# Patient Record
Sex: Female | Born: 1970 | Race: White | Hispanic: No | Marital: Married | Smoking: Current every day smoker
Health system: Southern US, Community
[De-identification: ages and names within clinical notes are randomized; demographics above are authoritative.]

## PROBLEM LIST (undated history)

## (undated) DIAGNOSIS — J45909 Unspecified asthma, uncomplicated: Secondary | ICD-10-CM

## (undated) DIAGNOSIS — D219 Benign neoplasm of connective and other soft tissue, unspecified: Secondary | ICD-10-CM

## (undated) DIAGNOSIS — M549 Dorsalgia, unspecified: Secondary | ICD-10-CM

## (undated) DIAGNOSIS — K219 Gastro-esophageal reflux disease without esophagitis: Secondary | ICD-10-CM

## (undated) DIAGNOSIS — D649 Anemia, unspecified: Secondary | ICD-10-CM

## (undated) DIAGNOSIS — Z9189 Other specified personal risk factors, not elsewhere classified: Secondary | ICD-10-CM

## (undated) DIAGNOSIS — F419 Anxiety disorder, unspecified: Secondary | ICD-10-CM

## (undated) DIAGNOSIS — G8929 Other chronic pain: Secondary | ICD-10-CM

## (undated) DIAGNOSIS — R87629 Unspecified abnormal cytological findings in specimens from vagina: Secondary | ICD-10-CM

## (undated) DIAGNOSIS — G4730 Sleep apnea, unspecified: Secondary | ICD-10-CM

## (undated) HISTORY — PX: MULTIPLE TOOTH EXTRACTIONS: SHX2053

## (undated) HISTORY — PX: TUBAL LIGATION: SHX77

## (undated) HISTORY — PX: OTHER SURGICAL HISTORY: SHX169

## (undated) HISTORY — PX: WISDOM TOOTH EXTRACTION: SHX21

---
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
## 1992-07-13 HISTORY — PX: RHINOPLASTY: SUR1284

## 1997-12-23 ENCOUNTER — Emergency Department (HOSPITAL_COMMUNITY): Admission: EM | Admit: 1997-12-23 | Discharge: 1997-12-23 | Payer: Self-pay | Admitting: Emergency Medicine

## 1998-01-15 ENCOUNTER — Emergency Department (HOSPITAL_COMMUNITY): Admission: EM | Admit: 1998-01-15 | Discharge: 1998-01-15 | Payer: Self-pay | Admitting: Emergency Medicine

## 1998-06-04 ENCOUNTER — Emergency Department (HOSPITAL_COMMUNITY): Admission: EM | Admit: 1998-06-04 | Discharge: 1998-06-04 | Payer: Self-pay | Admitting: Emergency Medicine

## 1998-06-04 ENCOUNTER — Encounter: Payer: Self-pay | Admitting: Emergency Medicine

## 1999-03-17 ENCOUNTER — Emergency Department (HOSPITAL_COMMUNITY): Admission: EM | Admit: 1999-03-17 | Discharge: 1999-03-18 | Payer: Self-pay | Admitting: Emergency Medicine

## 1999-04-21 ENCOUNTER — Encounter: Payer: Self-pay | Admitting: Emergency Medicine

## 1999-04-21 ENCOUNTER — Emergency Department (HOSPITAL_COMMUNITY): Admission: EM | Admit: 1999-04-21 | Discharge: 1999-04-21 | Payer: Self-pay | Admitting: Emergency Medicine

## 1999-08-09 ENCOUNTER — Emergency Department (HOSPITAL_COMMUNITY): Admission: EM | Admit: 1999-08-09 | Discharge: 1999-08-09 | Payer: Self-pay | Admitting: Internal Medicine

## 1999-08-13 ENCOUNTER — Encounter: Payer: Self-pay | Admitting: Emergency Medicine

## 1999-08-13 ENCOUNTER — Emergency Department (HOSPITAL_COMMUNITY): Admission: EM | Admit: 1999-08-13 | Discharge: 1999-08-13 | Payer: Self-pay | Admitting: Emergency Medicine

## 2000-01-31 ENCOUNTER — Emergency Department (HOSPITAL_COMMUNITY): Admission: EM | Admit: 2000-01-31 | Discharge: 2000-02-01 | Payer: Self-pay | Admitting: Emergency Medicine

## 2000-04-16 ENCOUNTER — Encounter: Admission: RE | Admit: 2000-04-16 | Discharge: 2000-04-16 | Payer: Self-pay | Admitting: Obstetrics & Gynecology

## 2000-05-07 ENCOUNTER — Encounter: Admission: RE | Admit: 2000-05-07 | Discharge: 2000-05-07 | Payer: Self-pay | Admitting: Obstetrics & Gynecology

## 2000-05-28 ENCOUNTER — Encounter: Admission: RE | Admit: 2000-05-28 | Discharge: 2000-05-28 | Payer: Self-pay | Admitting: Obstetrics & Gynecology

## 2000-06-24 ENCOUNTER — Ambulatory Visit (HOSPITAL_COMMUNITY): Admission: RE | Admit: 2000-06-24 | Discharge: 2000-06-24 | Payer: Self-pay

## 2000-08-24 ENCOUNTER — Encounter: Admission: RE | Admit: 2000-08-24 | Discharge: 2000-08-24 | Payer: Self-pay | Admitting: Obstetrics & Gynecology

## 2000-10-26 ENCOUNTER — Inpatient Hospital Stay (HOSPITAL_COMMUNITY): Admission: AD | Admit: 2000-10-26 | Discharge: 2000-10-26 | Payer: Self-pay | Admitting: *Deleted

## 2000-11-30 ENCOUNTER — Encounter: Admission: RE | Admit: 2000-11-30 | Discharge: 2000-11-30 | Payer: Self-pay | Admitting: Obstetrics & Gynecology

## 2001-04-05 ENCOUNTER — Emergency Department (HOSPITAL_COMMUNITY): Admission: EM | Admit: 2001-04-05 | Discharge: 2001-04-05 | Payer: Self-pay | Admitting: Emergency Medicine

## 2001-04-26 ENCOUNTER — Emergency Department (HOSPITAL_COMMUNITY): Admission: EM | Admit: 2001-04-26 | Discharge: 2001-04-26 | Payer: Self-pay | Admitting: Emergency Medicine

## 2001-06-07 ENCOUNTER — Encounter: Admission: RE | Admit: 2001-06-07 | Discharge: 2001-06-07 | Payer: Self-pay | Admitting: Obstetrics & Gynecology

## 2001-06-23 ENCOUNTER — Emergency Department (HOSPITAL_COMMUNITY): Admission: EM | Admit: 2001-06-23 | Discharge: 2001-06-23 | Payer: Self-pay | Admitting: Emergency Medicine

## 2001-07-12 ENCOUNTER — Inpatient Hospital Stay (HOSPITAL_COMMUNITY): Admission: AD | Admit: 2001-07-12 | Discharge: 2001-07-12 | Payer: Self-pay | Admitting: *Deleted

## 2001-07-12 ENCOUNTER — Encounter: Payer: Self-pay | Admitting: *Deleted

## 2001-09-27 ENCOUNTER — Encounter: Payer: Self-pay | Admitting: *Deleted

## 2001-09-27 ENCOUNTER — Emergency Department (HOSPITAL_COMMUNITY): Admission: EM | Admit: 2001-09-27 | Discharge: 2001-09-27 | Payer: Self-pay | Admitting: *Deleted

## 2001-10-13 ENCOUNTER — Encounter: Payer: Self-pay | Admitting: *Deleted

## 2001-10-13 ENCOUNTER — Emergency Department (HOSPITAL_COMMUNITY): Admission: EM | Admit: 2001-10-13 | Discharge: 2001-10-13 | Payer: Self-pay | Admitting: *Deleted

## 2001-11-27 ENCOUNTER — Emergency Department (HOSPITAL_COMMUNITY): Admission: EM | Admit: 2001-11-27 | Discharge: 2001-11-27 | Payer: Self-pay | Admitting: *Deleted

## 2001-11-27 ENCOUNTER — Encounter: Payer: Self-pay | Admitting: *Deleted

## 2001-12-19 ENCOUNTER — Encounter: Payer: Self-pay | Admitting: Emergency Medicine

## 2001-12-19 ENCOUNTER — Emergency Department (HOSPITAL_COMMUNITY): Admission: EM | Admit: 2001-12-19 | Discharge: 2001-12-19 | Payer: Self-pay | Admitting: Emergency Medicine

## 2002-02-07 ENCOUNTER — Encounter: Payer: Self-pay | Admitting: Emergency Medicine

## 2002-02-07 ENCOUNTER — Emergency Department (HOSPITAL_COMMUNITY): Admission: EM | Admit: 2002-02-07 | Discharge: 2002-02-07 | Payer: Self-pay | Admitting: Emergency Medicine

## 2002-12-26 ENCOUNTER — Encounter: Payer: Self-pay | Admitting: *Deleted

## 2002-12-26 ENCOUNTER — Inpatient Hospital Stay (HOSPITAL_COMMUNITY): Admission: AD | Admit: 2002-12-26 | Discharge: 2002-12-26 | Payer: Self-pay | Admitting: *Deleted

## 2003-03-13 ENCOUNTER — Encounter: Payer: Self-pay | Admitting: Emergency Medicine

## 2003-03-13 ENCOUNTER — Emergency Department (HOSPITAL_COMMUNITY): Admission: EM | Admit: 2003-03-13 | Discharge: 2003-03-13 | Payer: Self-pay | Admitting: Emergency Medicine

## 2003-03-21 ENCOUNTER — Encounter: Payer: Self-pay | Admitting: *Deleted

## 2003-03-21 ENCOUNTER — Emergency Department (HOSPITAL_COMMUNITY): Admission: EM | Admit: 2003-03-21 | Discharge: 2003-03-21 | Payer: Self-pay | Admitting: *Deleted

## 2003-04-27 ENCOUNTER — Emergency Department (HOSPITAL_COMMUNITY): Admission: EM | Admit: 2003-04-27 | Discharge: 2003-04-27 | Payer: Self-pay | Admitting: Emergency Medicine

## 2003-05-13 ENCOUNTER — Emergency Department (HOSPITAL_COMMUNITY): Admission: EM | Admit: 2003-05-13 | Discharge: 2003-05-14 | Payer: Self-pay | Admitting: Emergency Medicine

## 2003-06-12 ENCOUNTER — Emergency Department (HOSPITAL_COMMUNITY): Admission: EM | Admit: 2003-06-12 | Discharge: 2003-06-12 | Payer: Self-pay | Admitting: *Deleted

## 2003-07-15 ENCOUNTER — Emergency Department (HOSPITAL_COMMUNITY): Admission: EM | Admit: 2003-07-15 | Discharge: 2003-07-15 | Payer: Self-pay | Admitting: Emergency Medicine

## 2003-07-16 ENCOUNTER — Emergency Department (HOSPITAL_COMMUNITY): Admission: EM | Admit: 2003-07-16 | Discharge: 2003-07-16 | Payer: Self-pay | Admitting: Emergency Medicine

## 2003-08-31 ENCOUNTER — Emergency Department (HOSPITAL_COMMUNITY): Admission: EM | Admit: 2003-08-31 | Discharge: 2003-08-31 | Payer: Self-pay | Admitting: Emergency Medicine

## 2003-10-06 ENCOUNTER — Emergency Department (HOSPITAL_COMMUNITY): Admission: EM | Admit: 2003-10-06 | Discharge: 2003-10-07 | Payer: Self-pay | Admitting: Emergency Medicine

## 2003-10-08 ENCOUNTER — Emergency Department (HOSPITAL_COMMUNITY): Admission: EM | Admit: 2003-10-08 | Discharge: 2003-10-09 | Payer: Self-pay | Admitting: Emergency Medicine

## 2003-10-11 ENCOUNTER — Emergency Department (HOSPITAL_COMMUNITY): Admission: EM | Admit: 2003-10-11 | Discharge: 2003-10-11 | Payer: Self-pay | Admitting: *Deleted

## 2003-11-06 ENCOUNTER — Emergency Department (HOSPITAL_COMMUNITY): Admission: EM | Admit: 2003-11-06 | Discharge: 2003-11-06 | Payer: Self-pay | Admitting: Emergency Medicine

## 2003-12-09 ENCOUNTER — Emergency Department (HOSPITAL_COMMUNITY): Admission: EM | Admit: 2003-12-09 | Discharge: 2003-12-09 | Payer: Self-pay | Admitting: Emergency Medicine

## 2003-12-13 ENCOUNTER — Emergency Department (HOSPITAL_COMMUNITY): Admission: EM | Admit: 2003-12-13 | Discharge: 2003-12-13 | Payer: Self-pay | Admitting: Emergency Medicine

## 2003-12-17 ENCOUNTER — Emergency Department (HOSPITAL_COMMUNITY): Admission: EM | Admit: 2003-12-17 | Discharge: 2003-12-17 | Payer: Self-pay | Admitting: Emergency Medicine

## 2004-02-16 ENCOUNTER — Emergency Department (HOSPITAL_COMMUNITY): Admission: EM | Admit: 2004-02-16 | Discharge: 2004-02-16 | Payer: Self-pay | Admitting: Emergency Medicine

## 2004-05-20 ENCOUNTER — Emergency Department (HOSPITAL_COMMUNITY): Admission: EM | Admit: 2004-05-20 | Discharge: 2004-05-20 | Payer: Self-pay | Admitting: Emergency Medicine

## 2004-07-10 ENCOUNTER — Emergency Department (HOSPITAL_COMMUNITY): Admission: EM | Admit: 2004-07-10 | Discharge: 2004-07-10 | Payer: Self-pay | Admitting: Emergency Medicine

## 2004-07-13 HISTORY — PX: EXPLORATORY LAPAROTOMY: SUR591

## 2004-07-24 ENCOUNTER — Encounter (INDEPENDENT_AMBULATORY_CARE_PROVIDER_SITE_OTHER): Payer: Self-pay | Admitting: Specialist

## 2004-07-24 ENCOUNTER — Ambulatory Visit: Payer: Self-pay | Admitting: Family Medicine

## 2004-08-02 ENCOUNTER — Emergency Department (HOSPITAL_COMMUNITY): Admission: EM | Admit: 2004-08-02 | Discharge: 2004-08-02 | Payer: Self-pay | Admitting: Family Medicine

## 2004-08-27 ENCOUNTER — Emergency Department (HOSPITAL_COMMUNITY): Admission: EM | Admit: 2004-08-27 | Discharge: 2004-08-27 | Payer: Self-pay | Admitting: Family Medicine

## 2004-09-03 ENCOUNTER — Inpatient Hospital Stay (HOSPITAL_COMMUNITY): Admission: AD | Admit: 2004-09-03 | Discharge: 2004-09-03 | Payer: Self-pay | Admitting: Obstetrics and Gynecology

## 2004-09-05 ENCOUNTER — Encounter (INDEPENDENT_AMBULATORY_CARE_PROVIDER_SITE_OTHER): Payer: Self-pay | Admitting: Specialist

## 2004-09-05 ENCOUNTER — Other Ambulatory Visit: Admission: RE | Admit: 2004-09-05 | Discharge: 2004-09-05 | Payer: Self-pay | Admitting: Family Medicine

## 2004-09-05 ENCOUNTER — Ambulatory Visit: Payer: Self-pay | Admitting: Family Medicine

## 2004-09-16 ENCOUNTER — Ambulatory Visit: Payer: Self-pay | Admitting: Obstetrics & Gynecology

## 2004-10-10 ENCOUNTER — Emergency Department (HOSPITAL_COMMUNITY): Admission: EM | Admit: 2004-10-10 | Discharge: 2004-10-10 | Payer: Self-pay | Admitting: Family Medicine

## 2004-10-23 ENCOUNTER — Emergency Department (HOSPITAL_COMMUNITY): Admission: EM | Admit: 2004-10-23 | Discharge: 2004-10-23 | Payer: Self-pay | Admitting: Family Medicine

## 2004-11-11 ENCOUNTER — Encounter (INDEPENDENT_AMBULATORY_CARE_PROVIDER_SITE_OTHER): Payer: Self-pay | Admitting: Specialist

## 2004-11-11 ENCOUNTER — Inpatient Hospital Stay (HOSPITAL_COMMUNITY): Admission: AD | Admit: 2004-11-11 | Discharge: 2004-11-13 | Payer: Self-pay | Admitting: Obstetrics and Gynecology

## 2004-11-11 ENCOUNTER — Ambulatory Visit: Payer: Self-pay | Admitting: Family Medicine

## 2004-11-17 ENCOUNTER — Inpatient Hospital Stay (HOSPITAL_COMMUNITY): Admission: AD | Admit: 2004-11-17 | Discharge: 2004-11-17 | Payer: Self-pay | Admitting: Obstetrics and Gynecology

## 2004-11-26 ENCOUNTER — Ambulatory Visit: Payer: Self-pay | Admitting: Internal Medicine

## 2004-11-27 ENCOUNTER — Ambulatory Visit: Payer: Self-pay | Admitting: Family Medicine

## 2004-12-26 ENCOUNTER — Ambulatory Visit: Payer: Self-pay | Admitting: Obstetrics & Gynecology

## 2004-12-30 ENCOUNTER — Ambulatory Visit: Payer: Self-pay | Admitting: Internal Medicine

## 2005-01-01 ENCOUNTER — Ambulatory Visit: Payer: Self-pay | Admitting: Internal Medicine

## 2005-01-06 ENCOUNTER — Ambulatory Visit (HOSPITAL_COMMUNITY): Admission: RE | Admit: 2005-01-06 | Discharge: 2005-01-06 | Payer: Self-pay | Admitting: Internal Medicine

## 2005-01-16 ENCOUNTER — Ambulatory Visit: Payer: Self-pay | Admitting: Internal Medicine

## 2005-01-30 ENCOUNTER — Ambulatory Visit: Payer: Self-pay | Admitting: Family Medicine

## 2005-01-30 ENCOUNTER — Encounter (INDEPENDENT_AMBULATORY_CARE_PROVIDER_SITE_OTHER): Payer: Self-pay | Admitting: *Deleted

## 2005-01-30 ENCOUNTER — Other Ambulatory Visit: Admission: RE | Admit: 2005-01-30 | Discharge: 2005-01-30 | Payer: Self-pay | Admitting: Family Medicine

## 2005-05-15 ENCOUNTER — Emergency Department (HOSPITAL_COMMUNITY): Admission: EM | Admit: 2005-05-15 | Discharge: 2005-05-15 | Payer: Self-pay | Admitting: Family Medicine

## 2005-06-24 ENCOUNTER — Ambulatory Visit: Payer: Self-pay | Admitting: Internal Medicine

## 2005-07-20 ENCOUNTER — Ambulatory Visit: Payer: Self-pay | Admitting: Hospitalist

## 2005-07-20 ENCOUNTER — Ambulatory Visit (HOSPITAL_COMMUNITY): Admission: RE | Admit: 2005-07-20 | Discharge: 2005-07-20 | Payer: Self-pay | Admitting: Internal Medicine

## 2005-08-22 ENCOUNTER — Emergency Department (HOSPITAL_COMMUNITY): Admission: EM | Admit: 2005-08-22 | Discharge: 2005-08-22 | Payer: Self-pay | Admitting: Family Medicine

## 2005-11-05 ENCOUNTER — Encounter: Payer: Self-pay | Admitting: Family Medicine

## 2005-11-05 ENCOUNTER — Ambulatory Visit: Payer: Self-pay | Admitting: Family Medicine

## 2006-01-23 ENCOUNTER — Emergency Department (HOSPITAL_COMMUNITY): Admission: EM | Admit: 2006-01-23 | Discharge: 2006-01-24 | Payer: Self-pay | Admitting: Emergency Medicine

## 2006-01-27 ENCOUNTER — Encounter: Payer: Self-pay | Admitting: Internal Medicine

## 2006-01-27 ENCOUNTER — Ambulatory Visit: Payer: Self-pay | Admitting: Internal Medicine

## 2006-02-04 ENCOUNTER — Encounter: Payer: Self-pay | Admitting: Obstetrics and Gynecology

## 2006-02-04 ENCOUNTER — Ambulatory Visit: Payer: Self-pay | Admitting: Obstetrics and Gynecology

## 2006-05-14 DIAGNOSIS — F411 Generalized anxiety disorder: Secondary | ICD-10-CM | POA: Insufficient documentation

## 2006-05-14 DIAGNOSIS — I10 Essential (primary) hypertension: Secondary | ICD-10-CM | POA: Insufficient documentation

## 2006-05-14 DIAGNOSIS — K2100 Gastro-esophageal reflux disease with esophagitis, without bleeding: Secondary | ICD-10-CM | POA: Insufficient documentation

## 2006-05-14 DIAGNOSIS — N979 Female infertility, unspecified: Secondary | ICD-10-CM | POA: Insufficient documentation

## 2006-05-14 DIAGNOSIS — J329 Chronic sinusitis, unspecified: Secondary | ICD-10-CM | POA: Insufficient documentation

## 2006-05-21 ENCOUNTER — Encounter: Payer: Self-pay | Admitting: Internal Medicine

## 2006-07-12 ENCOUNTER — Emergency Department (HOSPITAL_COMMUNITY): Admission: EM | Admit: 2006-07-12 | Discharge: 2006-07-12 | Payer: Self-pay | Admitting: Emergency Medicine

## 2006-07-19 ENCOUNTER — Ambulatory Visit (HOSPITAL_COMMUNITY): Admission: RE | Admit: 2006-07-19 | Discharge: 2006-07-19 | Payer: Self-pay | Admitting: Hospitalist

## 2006-07-19 ENCOUNTER — Ambulatory Visit: Payer: Self-pay | Admitting: Hospitalist

## 2006-07-19 DIAGNOSIS — E678 Other specified hyperalimentation: Secondary | ICD-10-CM | POA: Insufficient documentation

## 2006-07-19 DIAGNOSIS — F329 Major depressive disorder, single episode, unspecified: Secondary | ICD-10-CM | POA: Insufficient documentation

## 2006-07-19 DIAGNOSIS — G4733 Obstructive sleep apnea (adult) (pediatric): Secondary | ICD-10-CM | POA: Insufficient documentation

## 2006-07-30 ENCOUNTER — Encounter (INDEPENDENT_AMBULATORY_CARE_PROVIDER_SITE_OTHER): Payer: Self-pay | Admitting: Internal Medicine

## 2006-07-30 DIAGNOSIS — M545 Low back pain, unspecified: Secondary | ICD-10-CM | POA: Insufficient documentation

## 2006-08-17 ENCOUNTER — Ambulatory Visit (HOSPITAL_BASED_OUTPATIENT_CLINIC_OR_DEPARTMENT_OTHER): Admission: RE | Admit: 2006-08-17 | Discharge: 2006-08-17 | Payer: Self-pay | Admitting: Internal Medicine

## 2006-08-20 ENCOUNTER — Telehealth: Payer: Self-pay | Admitting: *Deleted

## 2006-08-22 ENCOUNTER — Ambulatory Visit: Payer: Self-pay | Admitting: Internal Medicine

## 2006-08-25 ENCOUNTER — Ambulatory Visit: Payer: Self-pay | Admitting: Internal Medicine

## 2006-08-25 ENCOUNTER — Encounter (INDEPENDENT_AMBULATORY_CARE_PROVIDER_SITE_OTHER): Payer: Self-pay | Admitting: Internal Medicine

## 2006-08-27 ENCOUNTER — Encounter (INDEPENDENT_AMBULATORY_CARE_PROVIDER_SITE_OTHER): Payer: Self-pay | Admitting: Internal Medicine

## 2006-08-30 ENCOUNTER — Telehealth: Payer: Self-pay | Admitting: *Deleted

## 2006-09-01 ENCOUNTER — Ambulatory Visit (HOSPITAL_BASED_OUTPATIENT_CLINIC_OR_DEPARTMENT_OTHER): Admission: RE | Admit: 2006-09-01 | Discharge: 2006-09-01 | Payer: Self-pay | Admitting: *Deleted

## 2006-09-01 ENCOUNTER — Telehealth (INDEPENDENT_AMBULATORY_CARE_PROVIDER_SITE_OTHER): Payer: Self-pay | Admitting: *Deleted

## 2006-09-14 ENCOUNTER — Telehealth: Payer: Self-pay | Admitting: *Deleted

## 2006-10-19 ENCOUNTER — Encounter (INDEPENDENT_AMBULATORY_CARE_PROVIDER_SITE_OTHER): Payer: Self-pay | Admitting: Internal Medicine

## 2006-11-03 ENCOUNTER — Ambulatory Visit: Payer: Self-pay | Admitting: Hospitalist

## 2006-11-03 DIAGNOSIS — J069 Acute upper respiratory infection, unspecified: Secondary | ICD-10-CM | POA: Insufficient documentation

## 2006-11-10 ENCOUNTER — Telehealth (INDEPENDENT_AMBULATORY_CARE_PROVIDER_SITE_OTHER): Payer: Self-pay | Admitting: *Deleted

## 2006-12-07 ENCOUNTER — Emergency Department (HOSPITAL_COMMUNITY): Admission: EM | Admit: 2006-12-07 | Discharge: 2006-12-07 | Payer: Self-pay | Admitting: Family Medicine

## 2006-12-21 ENCOUNTER — Encounter (HOSPITAL_COMMUNITY): Admission: RE | Admit: 2006-12-21 | Discharge: 2007-01-20 | Payer: Self-pay | Admitting: Sports Medicine

## 2007-03-12 ENCOUNTER — Emergency Department (HOSPITAL_COMMUNITY): Admission: EM | Admit: 2007-03-12 | Discharge: 2007-03-12 | Payer: Self-pay | Admitting: Emergency Medicine

## 2007-03-31 ENCOUNTER — Encounter: Payer: Self-pay | Admitting: Obstetrics and Gynecology

## 2007-03-31 ENCOUNTER — Ambulatory Visit: Payer: Self-pay | Admitting: Obstetrics and Gynecology

## 2007-05-12 ENCOUNTER — Emergency Department (HOSPITAL_COMMUNITY): Admission: EM | Admit: 2007-05-12 | Discharge: 2007-05-12 | Payer: Self-pay | Admitting: Emergency Medicine

## 2007-06-22 ENCOUNTER — Ambulatory Visit (HOSPITAL_BASED_OUTPATIENT_CLINIC_OR_DEPARTMENT_OTHER): Admission: RE | Admit: 2007-06-22 | Discharge: 2007-06-22 | Payer: Self-pay | Admitting: Orthopedic Surgery

## 2007-07-14 DIAGNOSIS — M797 Fibromyalgia: Secondary | ICD-10-CM

## 2007-07-14 HISTORY — DX: Fibromyalgia: M79.7

## 2007-07-16 ENCOUNTER — Emergency Department (HOSPITAL_COMMUNITY): Admission: EM | Admit: 2007-07-16 | Discharge: 2007-07-16 | Payer: Self-pay | Admitting: Emergency Medicine

## 2007-08-15 ENCOUNTER — Ambulatory Visit: Payer: Self-pay | Admitting: Obstetrics and Gynecology

## 2007-08-15 ENCOUNTER — Other Ambulatory Visit: Payer: Self-pay | Admitting: Obstetrics & Gynecology

## 2007-08-21 ENCOUNTER — Inpatient Hospital Stay (HOSPITAL_COMMUNITY): Admission: AD | Admit: 2007-08-21 | Discharge: 2007-08-21 | Payer: Self-pay | Admitting: Obstetrics & Gynecology

## 2007-08-24 ENCOUNTER — Ambulatory Visit: Payer: Self-pay | Admitting: *Deleted

## 2007-09-07 ENCOUNTER — Inpatient Hospital Stay (HOSPITAL_COMMUNITY): Admission: AD | Admit: 2007-09-07 | Discharge: 2007-09-07 | Payer: Self-pay | Admitting: Family Medicine

## 2007-09-27 ENCOUNTER — Inpatient Hospital Stay (HOSPITAL_COMMUNITY): Admission: AD | Admit: 2007-09-27 | Discharge: 2007-09-27 | Payer: Self-pay | Admitting: Gynecology

## 2007-12-14 ENCOUNTER — Ambulatory Visit: Payer: Self-pay | Admitting: Obstetrics and Gynecology

## 2008-04-03 ENCOUNTER — Emergency Department (HOSPITAL_COMMUNITY): Admission: EM | Admit: 2008-04-03 | Discharge: 2008-04-03 | Payer: Self-pay | Admitting: Family Medicine

## 2008-08-22 ENCOUNTER — Ambulatory Visit (HOSPITAL_COMMUNITY): Admission: RE | Admit: 2008-08-22 | Discharge: 2008-08-22 | Payer: Self-pay | Admitting: Obstetrics and Gynecology

## 2008-08-28 ENCOUNTER — Emergency Department (HOSPITAL_COMMUNITY): Admission: EM | Admit: 2008-08-28 | Discharge: 2008-08-28 | Payer: Self-pay | Admitting: Emergency Medicine

## 2008-09-26 ENCOUNTER — Ambulatory Visit (HOSPITAL_COMMUNITY): Admission: RE | Admit: 2008-09-26 | Discharge: 2008-09-26 | Payer: Self-pay | Admitting: Obstetrics and Gynecology

## 2008-11-07 ENCOUNTER — Ambulatory Visit (HOSPITAL_COMMUNITY): Admission: RE | Admit: 2008-11-07 | Discharge: 2008-11-07 | Payer: Self-pay | Admitting: Obstetrics and Gynecology

## 2008-12-05 ENCOUNTER — Ambulatory Visit (HOSPITAL_COMMUNITY): Admission: RE | Admit: 2008-12-05 | Discharge: 2008-12-05 | Payer: Self-pay | Admitting: Obstetrics and Gynecology

## 2008-12-19 ENCOUNTER — Encounter: Admission: RE | Admit: 2008-12-19 | Discharge: 2008-12-19 | Payer: Self-pay | Admitting: Obstetrics and Gynecology

## 2009-01-09 ENCOUNTER — Ambulatory Visit (HOSPITAL_COMMUNITY): Admission: RE | Admit: 2009-01-09 | Discharge: 2009-01-09 | Payer: Self-pay | Admitting: Obstetrics and Gynecology

## 2009-01-30 ENCOUNTER — Inpatient Hospital Stay (HOSPITAL_COMMUNITY): Admission: AD | Admit: 2009-01-30 | Discharge: 2009-01-30 | Payer: Self-pay | Admitting: Obstetrics and Gynecology

## 2009-02-03 ENCOUNTER — Inpatient Hospital Stay (HOSPITAL_COMMUNITY): Admission: AD | Admit: 2009-02-03 | Discharge: 2009-02-03 | Payer: Self-pay | Admitting: Obstetrics and Gynecology

## 2009-02-06 ENCOUNTER — Encounter: Payer: Self-pay | Admitting: Obstetrics and Gynecology

## 2009-02-06 ENCOUNTER — Inpatient Hospital Stay (HOSPITAL_COMMUNITY): Admission: AD | Admit: 2009-02-06 | Discharge: 2009-02-06 | Payer: Self-pay | Admitting: Obstetrics and Gynecology

## 2009-02-13 ENCOUNTER — Inpatient Hospital Stay (HOSPITAL_COMMUNITY): Admission: AD | Admit: 2009-02-13 | Discharge: 2009-02-15 | Payer: Self-pay | Admitting: Obstetrics and Gynecology

## 2009-03-22 ENCOUNTER — Emergency Department (HOSPITAL_COMMUNITY): Admission: EM | Admit: 2009-03-22 | Discharge: 2009-03-22 | Payer: Self-pay | Admitting: Family Medicine

## 2009-06-23 ENCOUNTER — Emergency Department (HOSPITAL_COMMUNITY): Admission: EM | Admit: 2009-06-23 | Discharge: 2009-06-23 | Payer: Self-pay | Admitting: Family Medicine

## 2009-08-09 IMAGING — US US OB FOLLOW-UP
1 series · 14 of 28 positions shown · non-contrast
Comparison: none

OBSTETRICAL ULTRASOUND:
 This ultrasound was performed in The [HOSPITAL], and the [facility] report will be stored to [REDACTED] PACS.

[Series 1: us ob follow-up · 0.30mm/px · 14 of 30 slices shown]
[im 2/30]
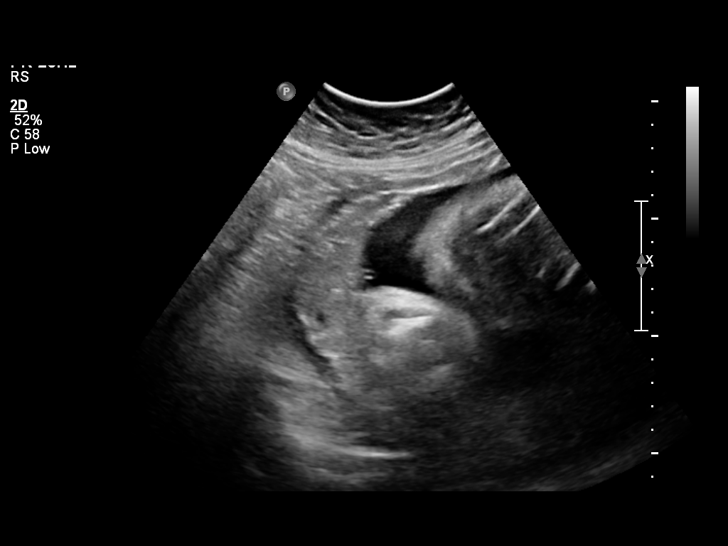
[im 4/30]
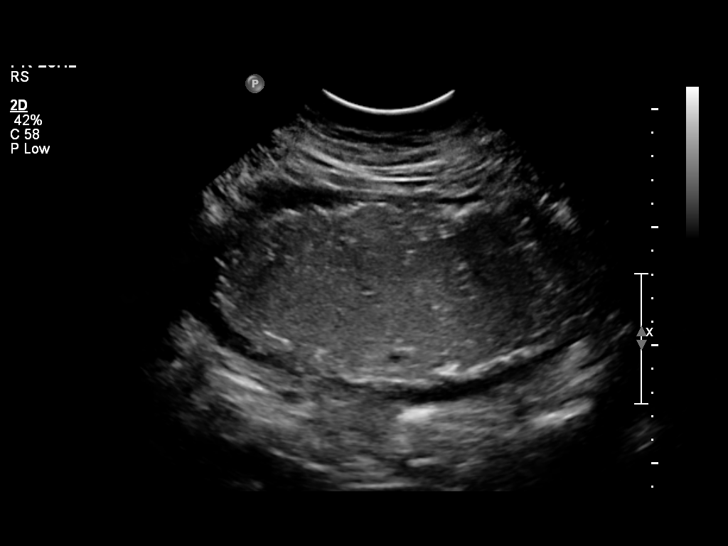
[im 6/30]
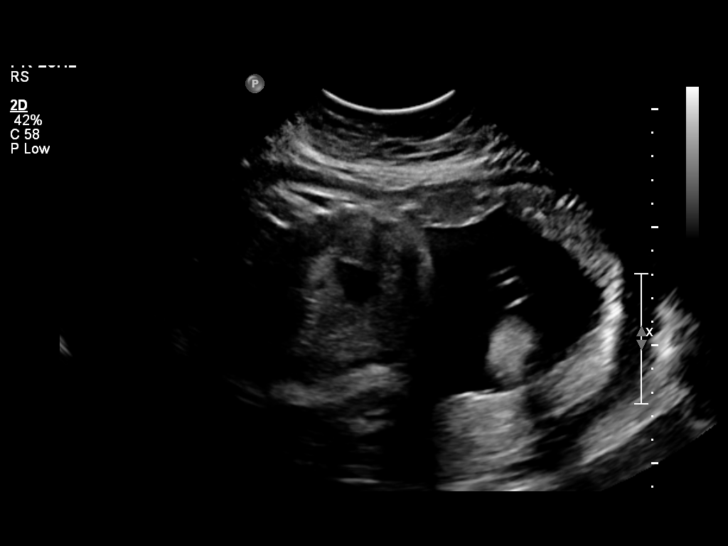
[im 8/30]
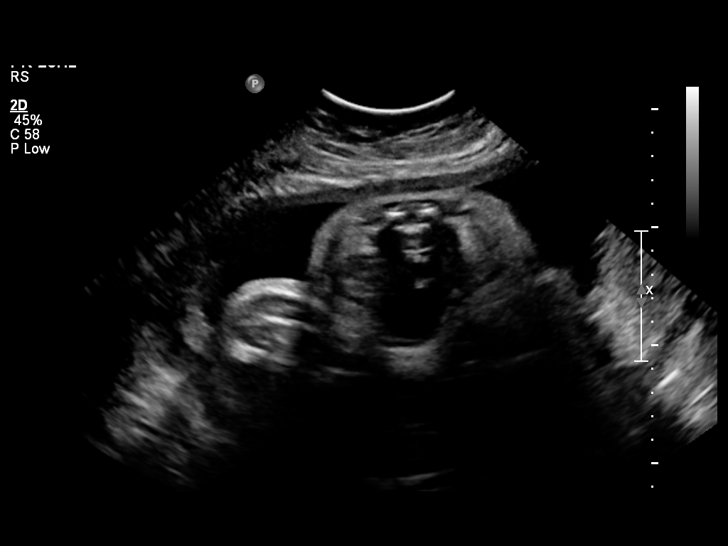
[im 10/30]
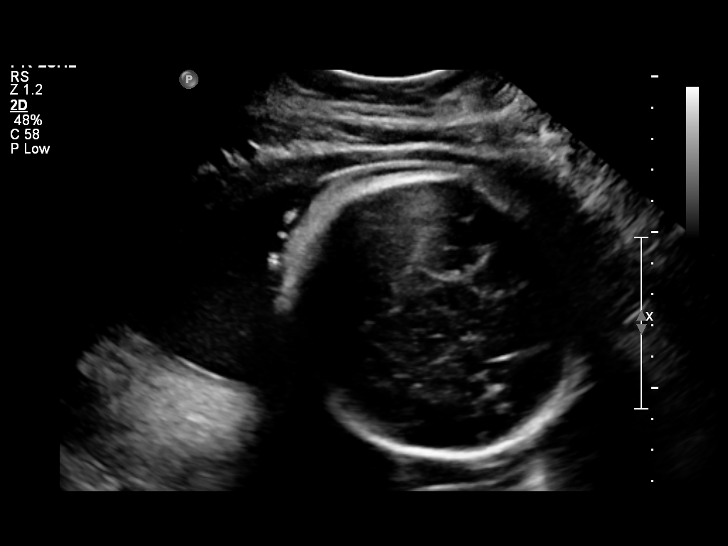
[im 12/30]
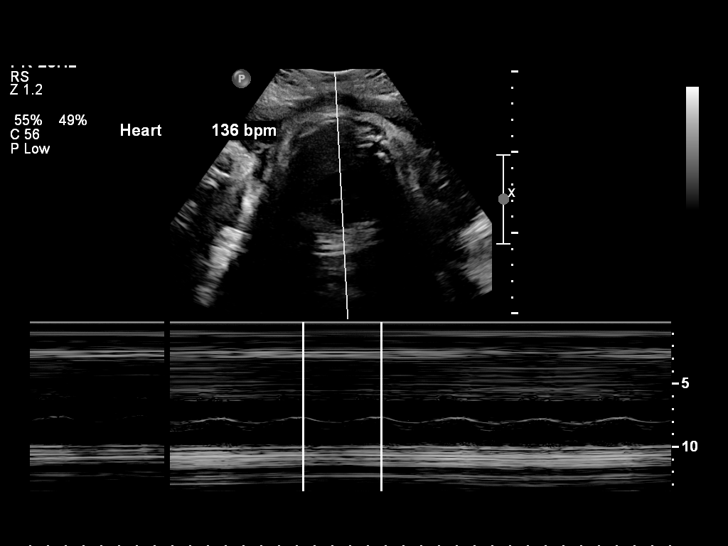
[im 14/30]
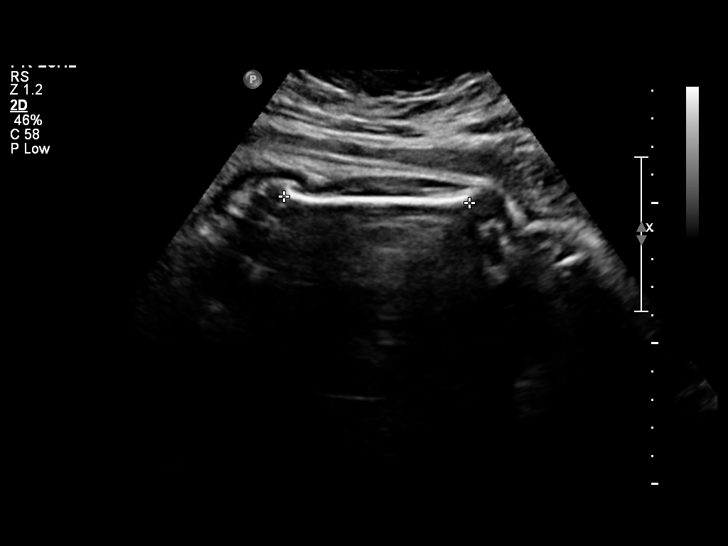
[im 17/30]
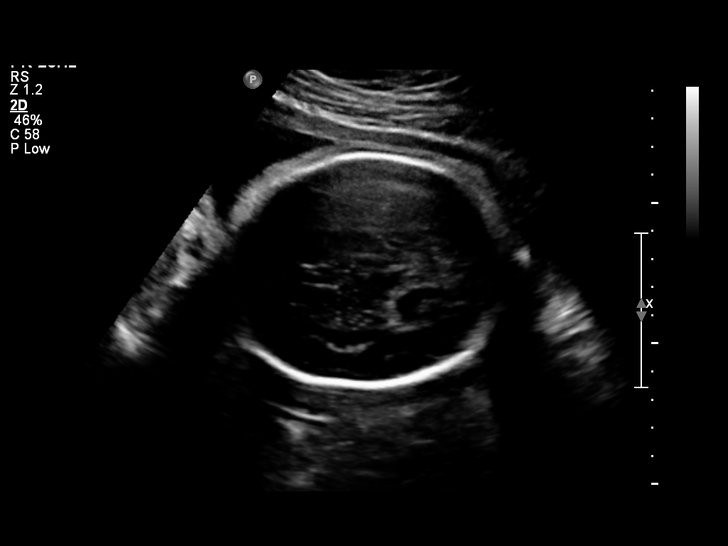
[im 19/30]
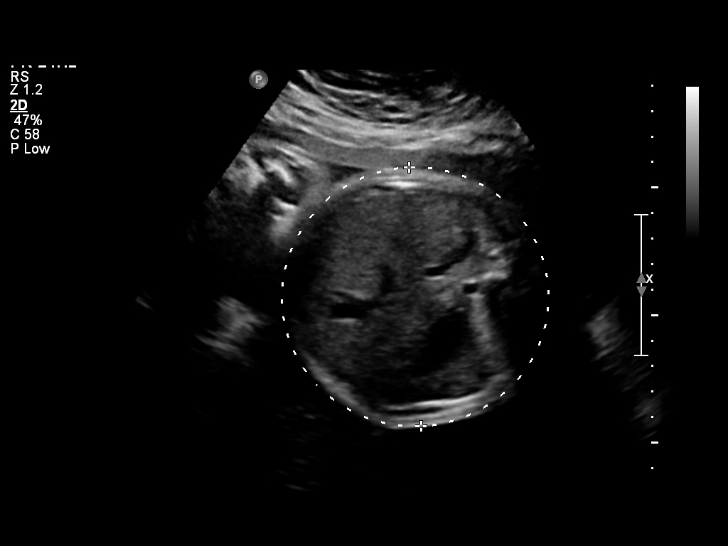
[im 21/30]
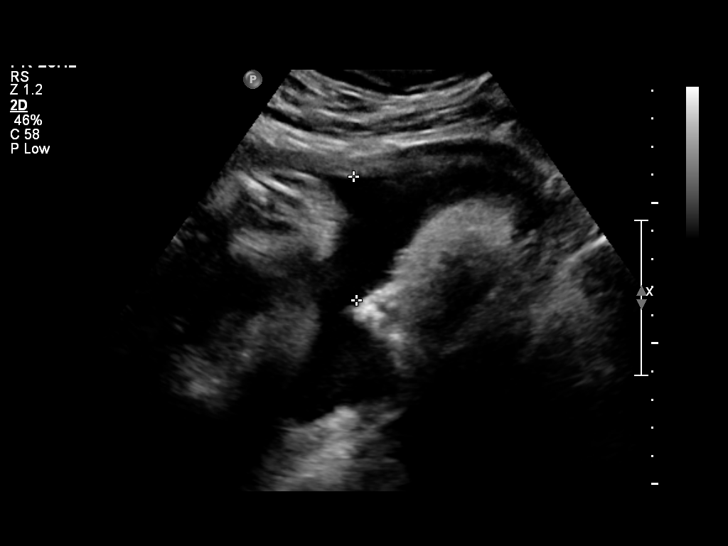
[im 23/30]
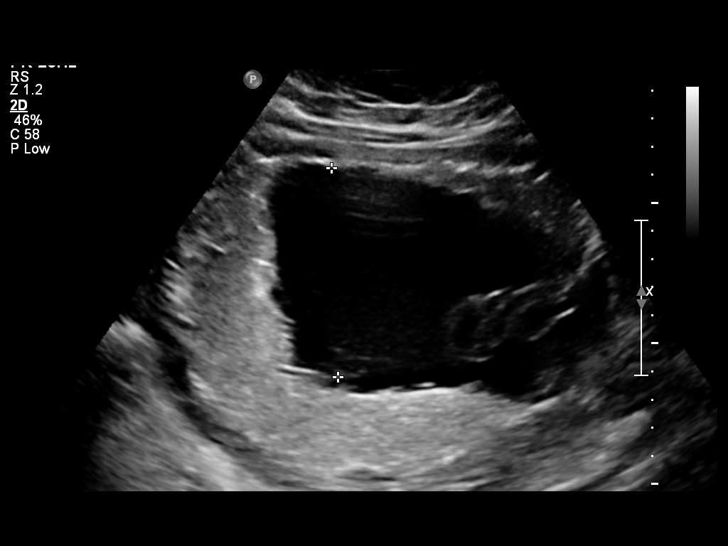
[im 25/30]
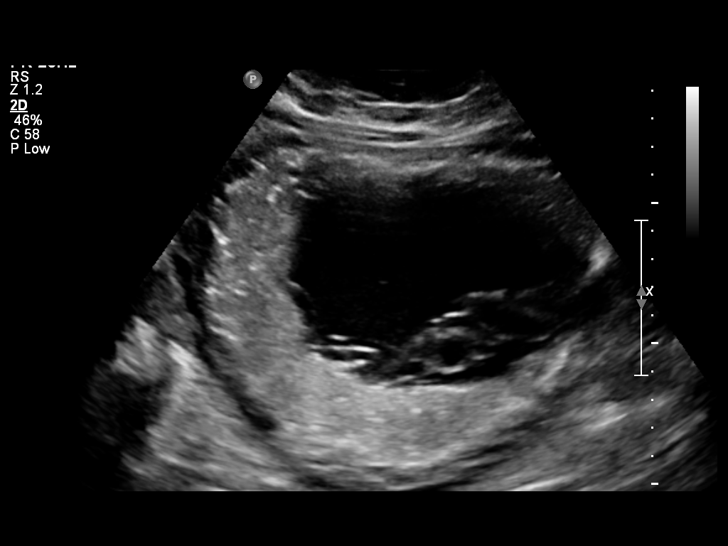
[im 27/30]
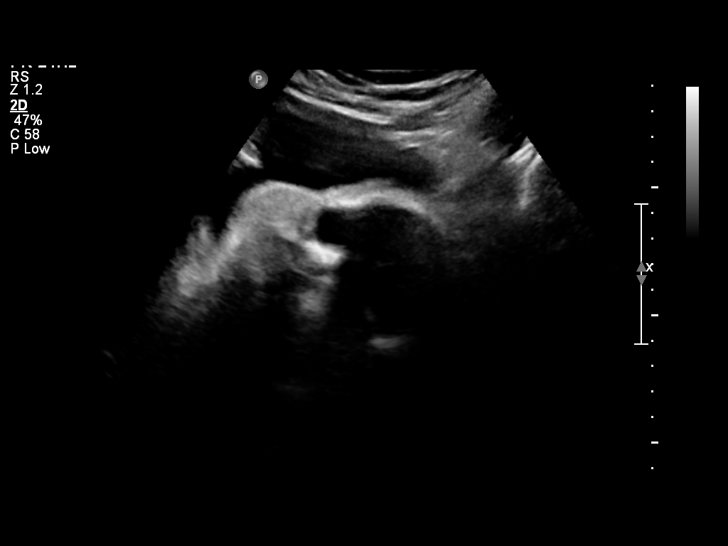
[im 30/30]
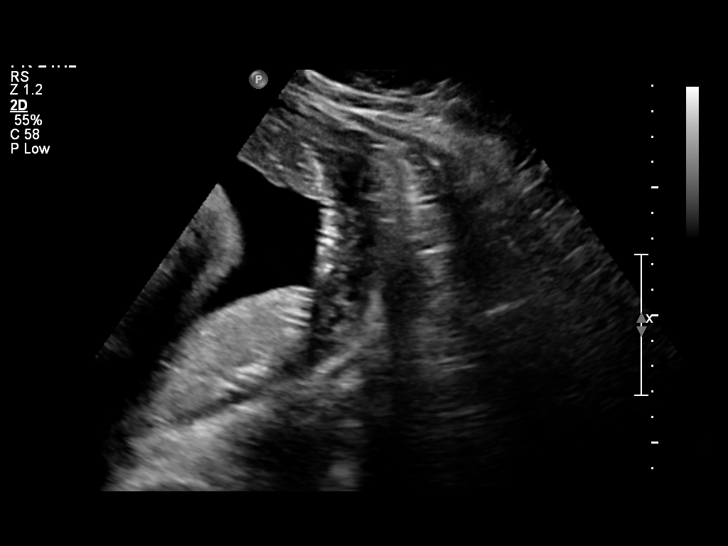

[14 of 28 positions shown; findings below may reference images not displayed]

IMPRESSION: [facility] has also been faxed to the ordering physician.

## 2009-10-20 IMAGING — CR DG LUMBAR SPINE 2-3V
3 series · 3 of 3 positions shown · non-contrast
Comparison: None.

CLINICAL DATA: Back pain.  Postpartum 1 month.

LUMBAR SPINE - 2-3 VIEW

[view not recorded (1 of 3)]
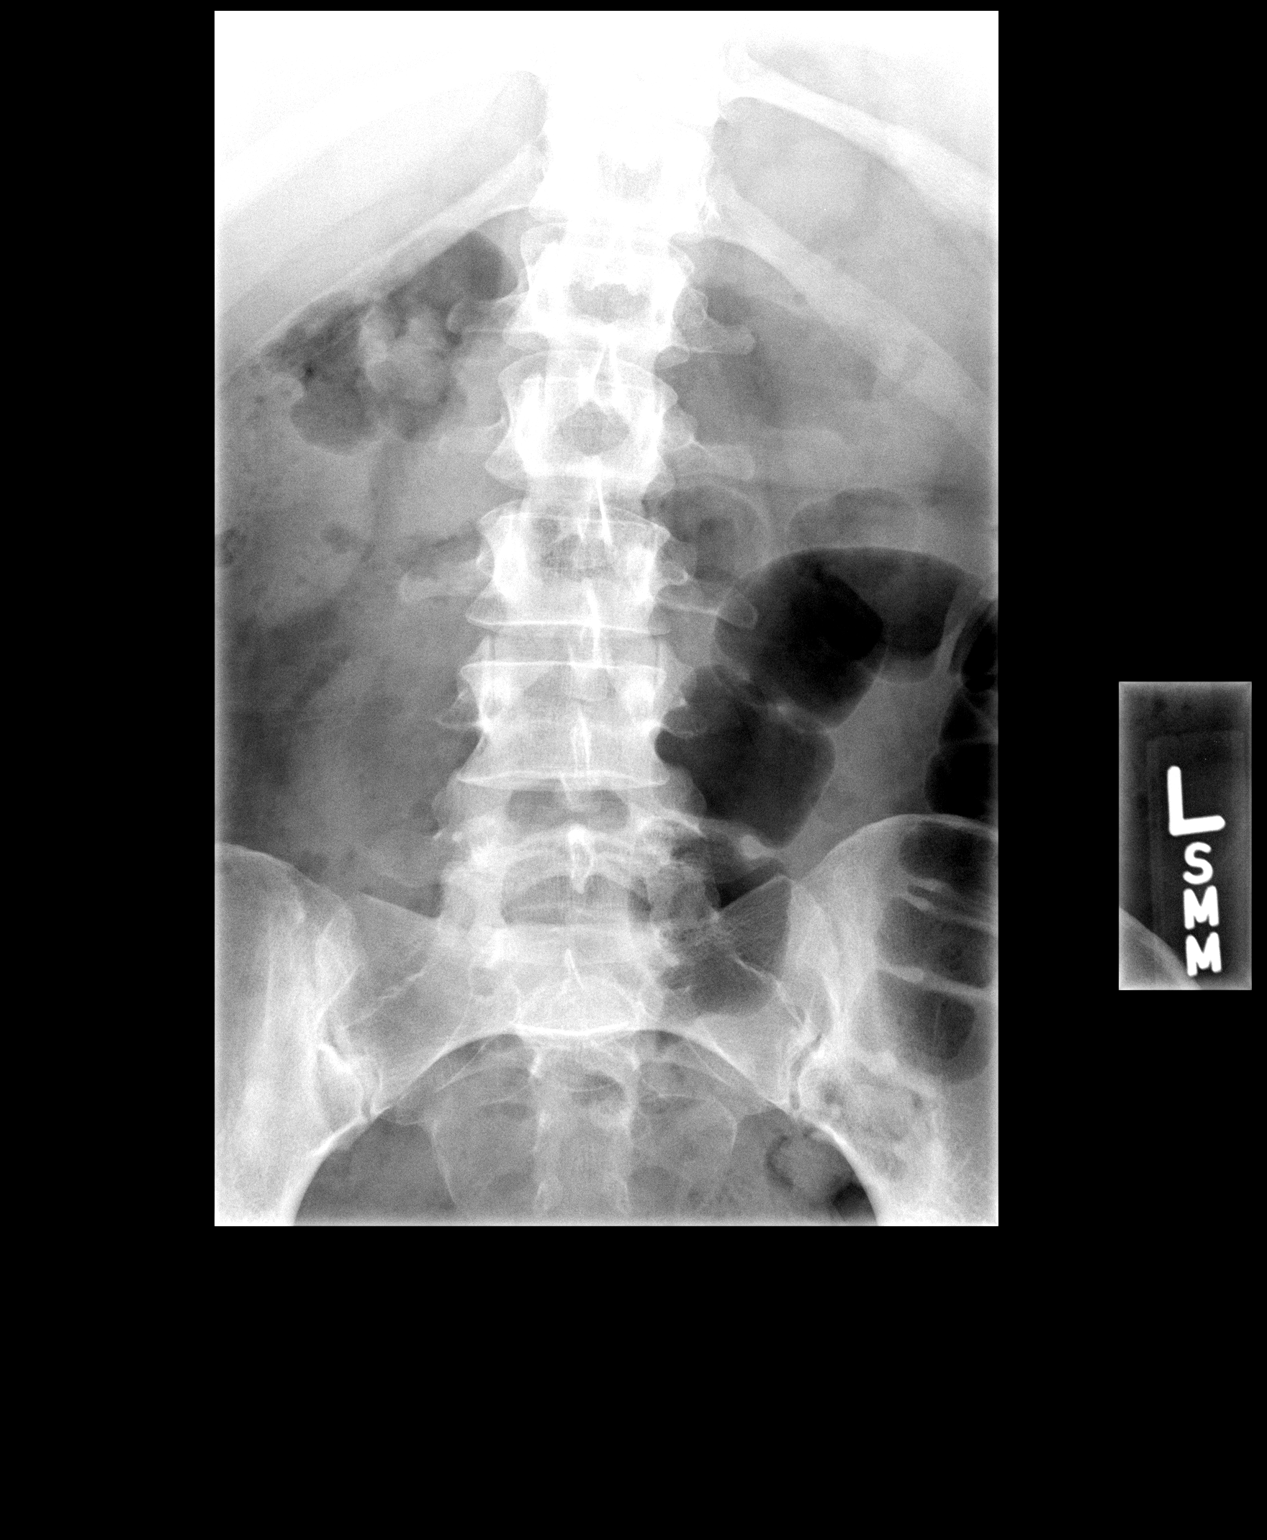

[view not recorded (2 of 3)]
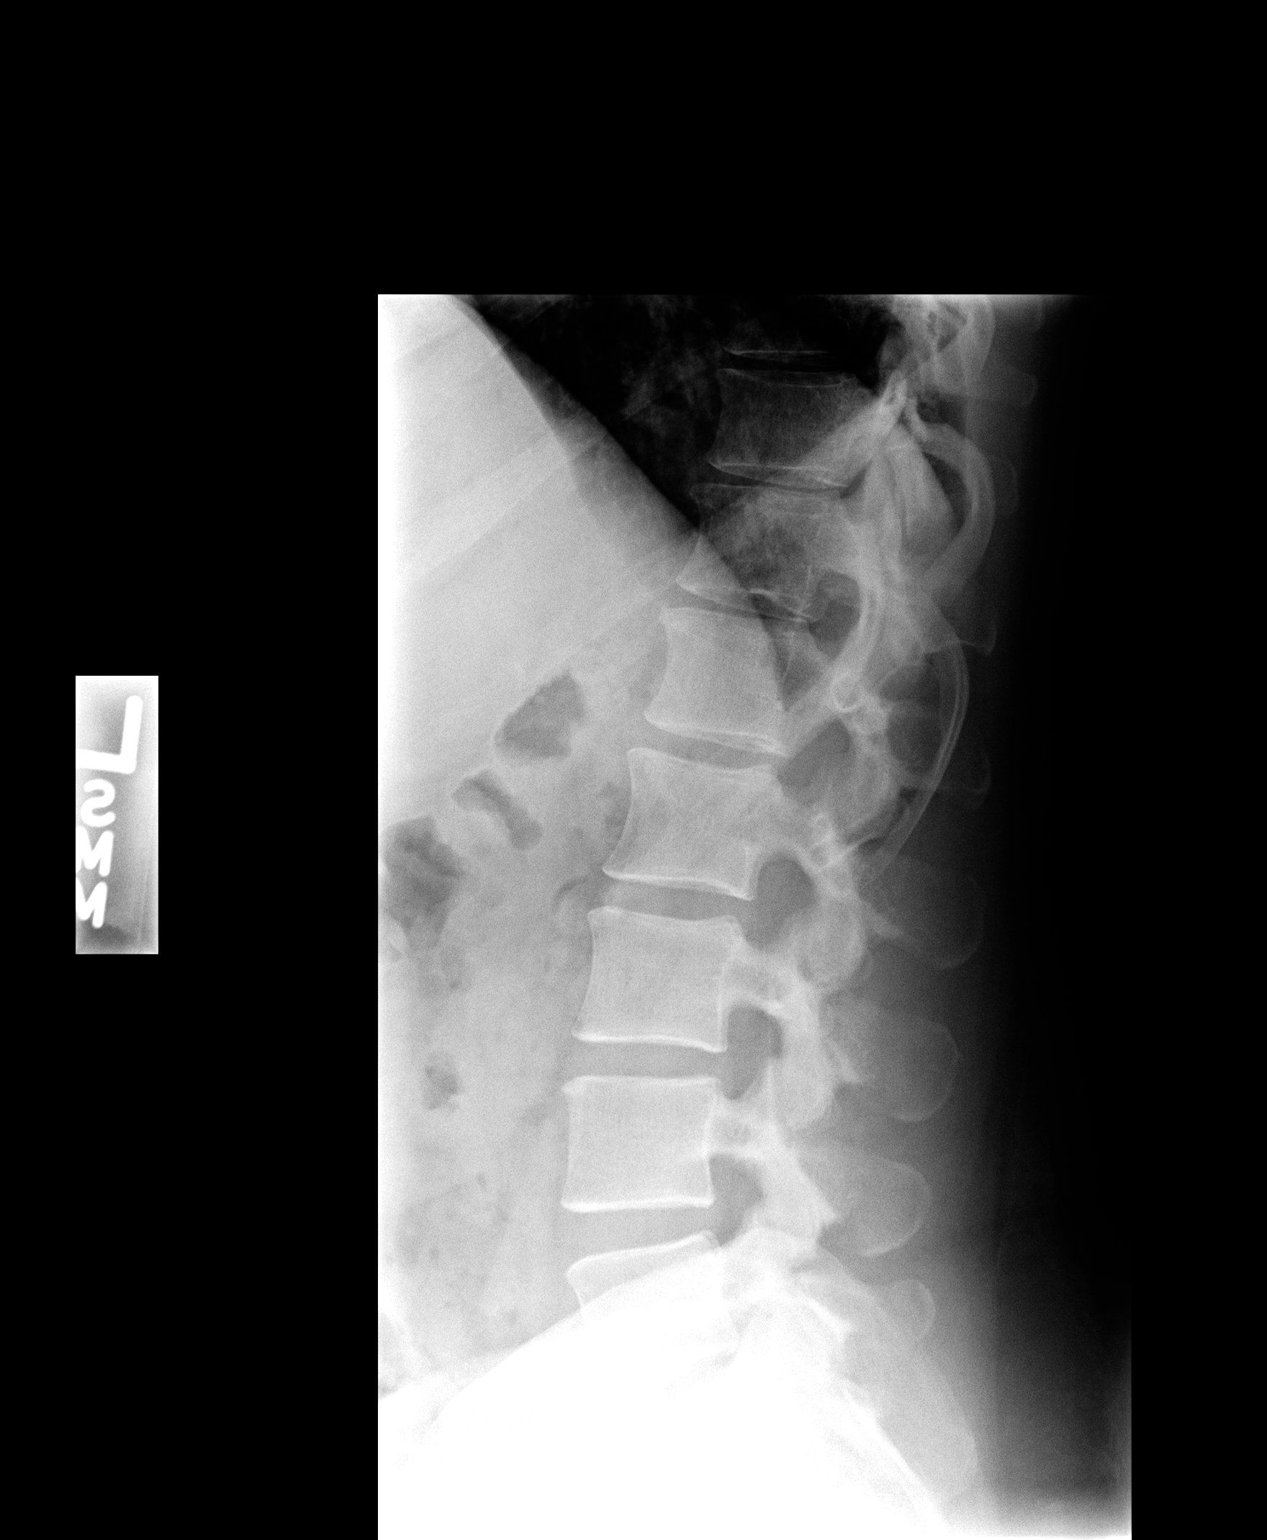

[view not recorded (3 of 3)]
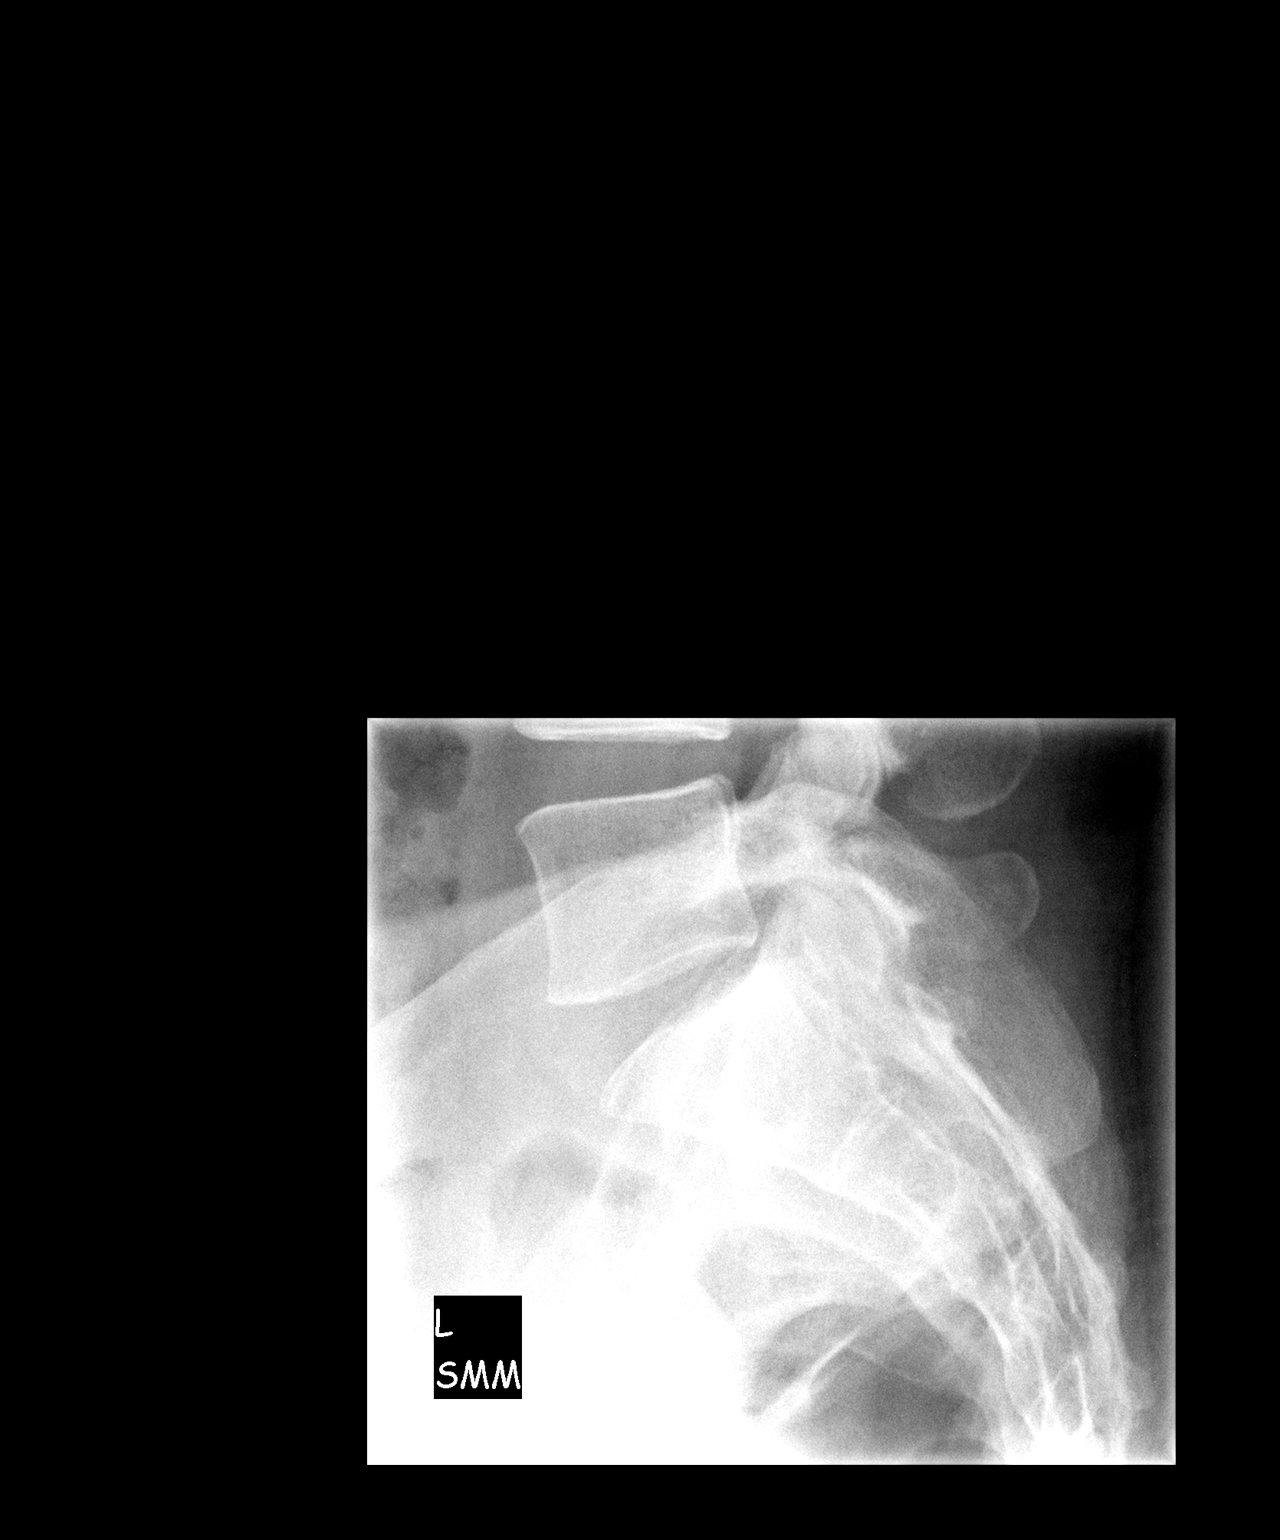

[3 of 3 positions shown; findings below may reference images not displayed]

FINDINGS: Normal lumbar alignment.  No significant disc
degeneration is present.  There is no vertebral spurring.  There is
no fracture or other bony abnormality.
IMPRESSION: Negative

## 2010-07-04 ENCOUNTER — Inpatient Hospital Stay (HOSPITAL_COMMUNITY)
Admission: AD | Admit: 2010-07-04 | Discharge: 2010-07-04 | Payer: Self-pay | Source: Home / Self Care | Attending: Obstetrics and Gynecology | Admitting: Obstetrics and Gynecology

## 2010-07-21 ENCOUNTER — Inpatient Hospital Stay (HOSPITAL_COMMUNITY)
Admission: RE | Admit: 2010-07-21 | Discharge: 2010-07-24 | Payer: Self-pay | Source: Home / Self Care | Attending: Obstetrics and Gynecology | Admitting: Obstetrics and Gynecology

## 2010-07-21 ENCOUNTER — Encounter (INDEPENDENT_AMBULATORY_CARE_PROVIDER_SITE_OTHER): Payer: Self-pay | Admitting: Obstetrics and Gynecology

## 2010-07-28 LAB — CBC
HCT: 26.4 % — ABNORMAL LOW (ref 36.0–46.0)
Hemoglobin: 7.9 g/dL — ABNORMAL LOW (ref 12.0–15.0)
MCH: 22.3 pg — ABNORMAL LOW (ref 26.0–34.0)
MCHC: 29.9 g/dL — ABNORMAL LOW (ref 30.0–36.0)
MCV: 74.6 fL — ABNORMAL LOW (ref 78.0–100.0)
Platelets: 251 10*3/uL (ref 150–400)
RBC: 3.54 MIL/uL — ABNORMAL LOW (ref 3.87–5.11)
RDW: 16.2 % — ABNORMAL HIGH (ref 11.5–15.5)
WBC: 13.6 10*3/uL — ABNORMAL HIGH (ref 4.0–10.5)

## 2010-07-28 LAB — GLUCOSE, CAPILLARY
Glucose-Capillary: 106 mg/dL — ABNORMAL HIGH (ref 70–99)
Glucose-Capillary: 89 mg/dL (ref 70–99)
Glucose-Capillary: 119 mg/dL — ABNORMAL HIGH (ref 70–99)
Glucose-Capillary: 95 mg/dL (ref 70–99)

## 2010-08-03 ENCOUNTER — Encounter: Payer: Self-pay | Admitting: Internal Medicine

## 2010-08-03 ENCOUNTER — Encounter: Payer: Self-pay | Admitting: *Deleted

## 2010-09-08 ENCOUNTER — Inpatient Hospital Stay (INDEPENDENT_AMBULATORY_CARE_PROVIDER_SITE_OTHER)
Admission: RE | Admit: 2010-09-08 | Discharge: 2010-09-08 | Disposition: A | Discharge status: Home / Self Care | Payer: BC Managed Care – PPO | Source: Ambulatory Visit | Attending: Family Medicine | Admitting: Family Medicine

## 2010-09-08 DIAGNOSIS — M549 Dorsalgia, unspecified: Secondary | ICD-10-CM

## 2010-09-22 LAB — RPR: RPR Ser Ql: NONREACTIVE

## 2010-09-22 LAB — CBC
MCV: 74.2 fL — ABNORMAL LOW (ref 78.0–100.0)
Platelets: 270 10*3/uL (ref 150–400)
RBC: 4.26 MIL/uL (ref 3.87–5.11)

## 2010-10-14 ENCOUNTER — Inpatient Hospital Stay (HOSPITAL_COMMUNITY)
Admission: AD | Admit: 2010-10-14 | Discharge: 2010-10-14 | Disposition: A | Discharge status: Home / Self Care | Payer: BC Managed Care – PPO | Source: Ambulatory Visit | Attending: Obstetrics and Gynecology | Admitting: Obstetrics and Gynecology

## 2010-10-14 ENCOUNTER — Inpatient Hospital Stay (HOSPITAL_COMMUNITY): Payer: BC Managed Care – PPO

## 2010-10-14 DIAGNOSIS — N83209 Unspecified ovarian cyst, unspecified side: Secondary | ICD-10-CM | POA: Insufficient documentation

## 2010-10-14 DIAGNOSIS — N949 Unspecified condition associated with female genital organs and menstrual cycle: Secondary | ICD-10-CM

## 2010-10-14 DIAGNOSIS — R1032 Left lower quadrant pain: Secondary | ICD-10-CM

## 2010-10-14 LAB — WET PREP, GENITAL
Clue Cells Wet Prep HPF POC: NONE SEEN
Trich, Wet Prep: NONE SEEN
Yeast Wet Prep HPF POC: NONE SEEN

## 2010-10-14 LAB — POCT PREGNANCY, URINE: Preg Test, Ur: NEGATIVE

## 2010-10-14 LAB — URINALYSIS, ROUTINE W REFLEX MICROSCOPIC
Bilirubin Urine: NEGATIVE
Hgb urine dipstick: NEGATIVE
Nitrite: NEGATIVE

## 2010-10-15 LAB — GC/CHLAMYDIA PROBE AMP, GENITAL
Chlamydia, DNA Probe: NEGATIVE
GC Probe Amp, Genital: NEGATIVE

## 2010-10-18 LAB — COMPREHENSIVE METABOLIC PANEL
ALT: 14 U/L (ref 0–35)
AST: 18 U/L (ref 0–37)
Alkaline Phosphatase: 129 U/L — ABNORMAL HIGH (ref 39–117)
CO2: 21 mEq/L (ref 19–32)
Chloride: 110 mEq/L (ref 96–112)
GFR calc non Af Amer: >60 mL/min (ref >60)
Glucose, Bld: 73 mg/dL (ref 70–99)
Potassium: 3.3 mEq/L — ABNORMAL LOW (ref 3.5–5.1)
Sodium: 139 mEq/L (ref 135–145)
Total Bilirubin: 0.2 mg/dL — ABNORMAL LOW (ref 0.3–1.2)

## 2010-10-18 LAB — CBC
HCT: 31.3 % — ABNORMAL LOW (ref 36.0–46.0)
Hemoglobin: 8.4 g/dL — ABNORMAL LOW (ref 12.0–15.0)
Hemoglobin: 8.6 g/dL — ABNORMAL LOW (ref 12.0–15.0)
MCHC: 33 g/dL (ref 30.0–36.0)
MCHC: 33.1 g/dL (ref 30.0–36.0)
MCHC: 33.3 g/dL (ref 30.0–36.0)
Platelets: 345 10*3/uL (ref 150–400)
RBC: 3.13 MIL/uL — ABNORMAL LOW (ref 3.87–5.11)
RBC: 3.17 MIL/uL — ABNORMAL LOW (ref 3.87–5.11)
WBC: 10.5 10*3/uL (ref 4.0–10.5)
WBC: 13.2 10*3/uL — ABNORMAL HIGH (ref 4.0–10.5)

## 2010-10-18 LAB — GLUCOSE, CAPILLARY: Glucose-Capillary: 142 mg/dL — ABNORMAL HIGH (ref 70–99)

## 2010-10-18 LAB — RPR: RPR Ser Ql: NONREACTIVE

## 2010-10-18 LAB — LACTATE DEHYDROGENASE: LDH: 131 U/L (ref 94–250)

## 2010-11-25 NOTE — Group Therapy Note (Signed)
Sonya, BECHLER NO.:  [id number]   MEDICAL RECORD NO.:  [id number]          PATIENT TYPE:  WOC   LOCATION:  [facility]                   FACILITY:  [facility]   PHYSICIAN:  Argentina Donovan, MD        DATE OF BIRTH:  03/21/1971   DATE OF SERVICE:  12/14/2007                                  CLINIC NOTE   The patient is a 40 year old white female gravida 2, para 1-0-1-1 who is  a longtime infertility patient, found that her husband had close to 90%  abnormal sperm, and then in January of this year, got pregnant,  spontaneously miscarried.  Ultrasound was done in followup and the  pelvic ultrasound turned out to be normal.  The patient since that time  of miscarriage in February 2009 had a normal period at the end of March,  April, and May, but she did have some mid cycle spotting starting 2 or 3  days after stopping of her period.  I have told her that she __________  time.  Her husband has stopped smoking.  He wants to repeat the semen  analysis, so I have written her a prescription for that.  Given her the  temperature charts, which I am going to have her keep for 3 months, and  then come in and see whether or not it might help, if she is continuing  the spotting, add Clomid to her regimen.  I have encouraged her to take  folic acid with prenatal vitamins, which she was not on at the time of  her last conception, and will follow through with that in about 3  months.  In addition to this, she had a chronic inguinal rash, which has  not been cleared up by antifungal therapy, and has developed a small  blister-type of rash on her hands with swelling of the joints.  We are  going to refer her to a dermatologist, although I think the hand rashes  are contact dermatitis.  We will draw an RPR today and just make sure  that is negative.   IMPRESSION:  1. Secondary infertility.  2. Intermenstrual spotting.  3. Inguinal and bilateral hand rash.      ______________________________  Argentina Donovan, MD     PR/MEDQ  D:  12/14/2007  T:  12/15/2007  Job:  [id number]

## 2010-11-25 NOTE — Group Therapy Note (Signed)
[name], [name] NO.:  [id number]   MEDICAL RECORD NO.:  [id number]          PATIENT TYPE:  WOC   LOCATION:  [facility]                   FACILITY:  [facility]   PHYSICIAN:  Argentina Donovan, MD        DATE OF BIRTH:  25-Dec-1970   DATE OF SERVICE:                                  CLINIC NOTE   Dictation ended at this point.           ______________________________  Argentina Donovan, MD     PR/MEDQ  D:  03/31/2007  T:  03/31/2007  Job:  [id number]

## 2010-11-25 NOTE — Op Note (Signed)
Sonya Berry, Sonya Berry NO.:  [id number]   MEDICAL RECORD NO.:  [id number]          PATIENT TYPE:  AMB   LOCATION:  [facility]                          FACILITY:  [facility]   PHYSICIAN:  Loreta Ave, M.D. DATE OF BIRTH:  [birth year]   DATE OF PROCEDURE:  06/22/2007  DATE OF DISCHARGE:                               OPERATIVE REPORT   PREOPERATIVE DIAGNOSIS:  Recurrent sprains, right ankle with lateral  instability.   POSTOPERATIVE DIAGNOSIS:  Recurrent sprains, right ankle with lateral  instability with intra-articular synovitis and a post-traumatic fibrous  band across the front of the ankle creating mechanical symptoms.   PROCEDURE:  Right ankle exam under anesthesia with fluoroscopic stress  views.  Arthroscopy with excision of fibrous band and partial  synovectomy.  Open lateral reconstruction utilizing split peroneus  brevis tendon autograft.  Also excision of the anterolateral ganglion  off the front of the ankle.   SURGEON:  Loreta Ave, M.D.   ASSISTANT:  Zonia Kief, PA   ANESTHESIA:  General.   BLOOD LOSS:  Minimal.   TOURNIQUET TIME:  1 hour 15 minutes.   SPECIMENS:  None.   CULTURES:  None.   COMPLICATIONS:  None.   DRESSING:  Soft compressive with short-leg splint.   PROCEDURE:  The patient brought to the operating room and after adequate  anesthesia obtained, ankle examined.  Fluoroscopic guidance.  Open about  15 degrees to talar tilt with a pronounced clunking in the ankle both  with plantar flexion, dorsiflexion and tilt maneuver.  Tourniquet  applied.  Stirrup applied.  Prepped and draped in the usual sterile  fashion.  Exsanguinated with elevation and Esmarch, tourniquet inflated  to 350 mmHg.  Anterolateral, anteromedial portals into the ankle.  The  small arthroscope for ankle arthroscopy was used.  This introduced,  ankle distended and inspected.  There was a large prominent fibrotic  band going from the front of the lateral  malleolus to the front of  medial malleolus over top of the talus.  This would clunk over the front  of the talus with tilt and motion.  Excised in its entirety.  Appeared  to be post-traumatic.  Hypertrophic synovitis of front of the ankle  debrided.  I got an excellent view the entire ankle, no osteochondral  lesions.  No chondral breakdown.  I also on all way back in the  posterior recess where there was really nothing to suggest impingement.  I could easily see the tendon sheath of the FHL.  No ganglion in that  area.  Instruments fluid removed.  Incision was made along the course of  peroneal tendon behind the fibula down to the base of fifth metatarsal.  Skin subcutaneous tissue divided. Skin flaps were kept very generous on  both sides.  This was taken up anteriorly to the anterolateral aspect of  the ankle where the ligamentous structures were markedly attenuated and  thinned.  There was about a centimeter sized ganglion coming off the  anterolateral aspect of the ankle and it was excised in its entirety all  way down the ankle  joint.  The peroneal tendon was then exposed.  I used  a brevis tendon which was split from the head of the metatarsal all the  way up to the musculotendinous junction.  The peroneal tendon sheath was  left intact to prevent subluxation later.  The graft brought out  distally with the attachment still intact to the base of fifth  metatarsal.  Captured with FiberWire suture.  I then tracked the graft  through the remnant of ATF ligament up to the front of the fibula at the  level of the mortise.  A drill hole of the fibula from front to back was  made.  The graft was passed through there.  It was then brought outside  the peroneal tendons to the lateral wall of the os calcis.  This was  exposed subperiosteally and a drill hole made on either side of the  vertical region of the os calcis.  Very osteopenic bone noted.  I had to  use a second distal drill hole  because the first one ripped out even  though it was almost a centimeter from the entrance hole.  I finally had  a nice entrance all coming directly down from the fibula.  The graft was  then taken from the back of the fibula through into the os calcis and  sutures brought out the other end.  Holding the foot in neutral position  and slightly everting it, the graft was tensioned throughout.  Its  attachment was reinforced to the front of fibula with a FiberWire suture  and then the back of the fibula with another FiberWire suture.  Where  the sutures came out of the hole the os calcis, these were brought back  over top of bone and sutured down to the graft to anchor it there,  pulling the graft down into the bone.  When all this was complete, I had  full motion and excellent stability.  Stress views with fluoroscopy  confirmed that it did not open at all to tilt.  The course of the graft  was well positioned so it would not be too prominent.  The seral nerve  had been protected throughout.  Wound was thoroughly irrigated.  Skin  flaps allowed to close.  Closed with Vicryl and then staples.  Portals  closed with nylon.  Margins of wound were injected with Marcaine without  epinephrine.  Sterile compressive dressing applied.  Short-leg splint  applied.  Tourniquet was deflated, removed.  Anesthesia reversed.  Brought to recovery room.  Tolerated surgery well.  No complications.      Loreta Ave, M.D.  Electronically Signed     DFM/MEDQ  D:  06/23/2007  T:  06/23/2007  Job:  [id number]

## 2010-11-25 NOTE — Group Therapy Note (Signed)
Sonya Berry, Sonya Berry NO.:  [id number]   MEDICAL RECORD NO.:  [id number]          PATIENT TYPE:  WOC   LOCATION[facility]                   FACILITY:  [facility]   PHYSICIAN:  Argentina Donovan, MD        DATE OF BIRTH:  [birth year]   DATE OF SERVICE:                                  CLINIC NOTE   The patient is a 40 year old Caucasian female gravida 1 para 1-0-0-1.  A  previous partner who has a infertility problem secondary to husband's  abnormally formed sperm comes in today for her routine GYN visit and  also has been complaining of increasing symptoms on the few days before  her period including emotional crying, depression, anger, breast  tenderness, abdominal bloating and cramps.  I have discussed in detail  premenstrual syndrome and PMDD and we have suggested that we try her on  Prozac 20 mg a day prior to that time and up until her period each month  to see if that will help control the symptoms and went into the etiology  so far.   EXAMINATION:  BREASTS:  Large, pendulous with no dominant masses and no  nipple discharge.  ABDOMEN:  Soft, nontender.  No masses or organomegaly although she had  some small amount of tenderness in the left lower quadrant on  superficial palpation.  I looked for the possibility of a hernia there  but could find none.  GENITALIA:  External genitalia is normal.  She has a small inguinal  intertrigo and the introitus was marital.  BUS within normal limits, the  vagina clean and well rugated.  The cervix clean, parous and well  epithelialized.  The uterus and adnexa could not be well outlined but  showed no signs of tenderness and the rectum revealed no masses.   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  The patient had a Pap smear with a prescription for Prozac 20  mg q.a.m. as needed and also for Mycolog II cream.  Have  discussed with  the patient the use of aluminum antiperspirants to control the  intertrigo in the inguinal area.           ______________________________  Argentina Donovan, MD     PR/MEDQ  D:  03/31/2007  T:  03/31/2007  Job:  [id number]

## 2010-11-25 NOTE — Letter (Signed)
August 22, 2008    Guy Sandifer. Henderson Cloud, MD  [address]  [place], [place] [address]   RE:   Sonya Berry  MR#   [id number]  ACC#        [id number]   MFM CONSULTATION REPORT   Dear Dr. Henderson Cloud:   I had the pleasure of seeing your patient, Sonya Berry, for a  consultation secondary to numerous high risk factors complicating her  pregnancy.  Her history is the following:   First pregnancy with delivery in 1996, at 45 weeks' gestation with a  normal spontaneous vaginal delivery.  The patient gives a history of  having been on bed rest for the last 2 months of pregnancy, but actually  had undergone induction at 39 weeks due to a complaint of persistent  back pain per the patient.  Second pregnancy in 2009, had early missed  AB, for which she spontaneously miscarried.  Of note, also is that she  had a myomectomy performed at [facility] in 2006, by Dr. Shawnie Pons.  Per the patient, the myoma was somewhat involving the tube, and the  patient was told she could not labor after having undergone the  myomectomy.  I understand that you have requested records of that  operation, and we will also do that to look to see if she needs a  cesarean section with this pregnancy.  With the current pregnancy, her  [facility] is February 24, 2009, putting her at 13-3/7th weeks' gestation.  Her  pregnancy risk factors are the following, and I will outline the  management approach for each of the risk factors.  1.  Advanced maternal age.  The patient is 40 years old and therefore,  she has slight increased risk of having the baby with fetal karyotype  abnormalities.  I discussed with her the noninvasive screening options  and my understanding is that she had first trimester screening in your  office, which according to the patient has returned with a risk of Down  syndrome of 1 in 4000.  I also discussed the utility of targeted  ultrasound looking for any anomalies, but also looking for any self  markers of Down syndrome, which should be performed at 18-20 weeks'  gestation.  The patient did not wish to have an amniocentesis unless  some of the noninvasive screening tests have returned abnormal.  2.  History of a myomectomy as outlined above.  I would recommend  obtaining operative note from the procedure in 2006.  Based upon what  the patient has told me and what she was told following the operation, I  doubt that she will need to have a cesarean section for this pregnancy.  3.  History of asthma.  The patient reports a history of mild asthma in  the past and reports having to periodically use an albuterol inhaler,  but reports more seasonal allergies, than a true history of asthma.  She  was given a prescription for albuterol and Flovent inhalers recently,  but has not yet filled those prescriptions.  My understanding from her  history is the asthma should not be a significant concern during the  pregnancy, but agree with having an inhaler available.  Although, there  is some recommendation to do a peak flow meter, I think because of some  compliance issue that she may not be compliant with this sort of  approach.  If in fact she does have to be hospitalized for worsening  asthmatic attack, then  I would strongly recommend following her with  peak flow meter testing.  I did encourage her to cut back or to quit  smoking, which maybe an exacerbating factor to her mild underlying  asthma.  4.  Chronic hypertension.  The patient gives a history of having taken  HCTZ in the past, but is currently not taking any antihypertensive  medications.  Her blood pressure here today was 125/77.  Because of her  history of hypertension necessitating medication in the past, I would,  however, manage her pregnancy as if she is a chronic hypertensive.  For  that reason, I will outline the management plan of serial ultrasound  examinations for growth and to begin nonstress testing at 32 weeks'   gestation with twice weekly NSTs.  If serial ultrasounds for growth show  any abnormal growth, antenatal surveillance in terms of nonstress test  for BPP should be initiated sooner.  In addition, her moderate obesity  would also necessitate some serial ultrasounds for growth secondary to  the relative lack of her reliability of fundal height measurements with  her.  In addition, I would recommend a baseline 24-hour urine for  protein and creatinine clearance if this has not already been done.  Finally with her obesity, I think it is reasonable to obtain a baseline  CMP because of the potential of some fatty liver changes, which may  produce mild elevations in her liver function studies.  It may  complicate the diagnosis of preeclampsia later in the pregnancy.   We would be glad to co-manage her pregnancy due to her multiple risk  factors, and I have scheduled her back for a ultrasound examination at  approximately 19 weeks' gestation for full anatomic survey.   I again want to thank you for sending your patient to [place] for pregnancy  consultation.  If you have any questions about my general  recommendations, please feel free to contact me.   Sincerely,   Sincerely,      Felipa Eth, MD      DCM/MEDQ  D:  08/22/2008  T:  08/23/2008  Job:  [id number]

## 2010-11-28 NOTE — Group Therapy Note (Signed)
Sonya Berry, HEBDON NO.:  [id number]   MEDICAL RECORD NO.:  [id number]          PATIENT TYPE:  WOC   LOCATION[facility]  [facility]                   FACILITY:  [facility]   PHYSICIAN:  Elsie Lincoln, MD      DATE OF BIRTH:  1970-07-25   DATE OF SERVICE:                                    CLINIC NOTE   The patient is a 40 year old female who underwent exploratory laparotomy for  a right ovarian mass and chronic pelvic pain.  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  The patient  was complaining yesterday of drainage from her belly button.  She also was  complaining of being tired.  She is complaining of social problems, her cat  dying, her best friend trying to get her arrested, stressed out, panic  disorder, cannot get into the see the doctor, worried she has diabetes and  wants to get pregnant.  We addressed some of these concerns today.   Her belly button is well healed from the diagnostic laparoscopy.  There is  no drainage.  The Pfannenstiel skin incision is well healed and her abdomen  is benign.   As far as her social situation, I advised her to get back in touch with  behavioral health.  She has not called for an appointment, but did request  something for her nerves.  I offered her Zoloft.  She said it made her  have insomnia.  So, I gave her 10 mg of Ativan 2 mg p.o. q.8h with no  refills.  The patient understands she will not be getting any more  psychotropic medication from this clinic.   Her blood pressure was 144/109 and she stated that she has been checking her  sugars with her mother's Glucometer and had one isolated sugar yesterday of  180.  I suggested the patient get a GTT.  We did order this test for her.  Before she gets pregnant, she needs to have her psychological problems fully  evaluated and treated.  Her blood pressure needs to be under control and she  needs to lose weight and manage her diabetes.  She  also has a history of a  high grade Pap smear with a colposcopy that showed no dysplasia.  The  patient needs a repeat Pap smear and colposcopy next month and this has been  scheduled.  In the meantime, she is to follow up with behavioral health and  [facility].       KL/MEDQ  D:  12/26/2004  T:  12/26/2004  Job:  [id number]

## 2010-11-28 NOTE — Procedures (Signed)
NAMEWILLYE, Sonya Berry NO.:  [id number]   MEDICAL RECORD NO.:  [id number]          PATIENT TYPE:  OUT   LOCATION:  SLEEP CENTER                 FACILITY:  [facility]   PHYSICIAN:  Clinton D. Maple Hudson, MD, FCCP, FACPDATE OF BIRTH:  04[birth year]/1972   DATE OF STUDY:  07/19/2006                            NOCTURNAL POLYSOMNOGRAM   REFERRING PHYSICIAN:  Cristal Deer End MD   INDICATION FOR STUDY:  Hypersomnia with sleep apnea.   EPWORTH SLEEPINESS SCORE:  15/24.  BMI 39.6.  Weight 238 pounds.   MEDICATIONS:  Home medications were listed and reviewed.   SLEEP ARCHITECTURE:  Total sleep time 313 minutes with sleep efficiency  70%.  Stage I was 5%, stage II 67%, stages III and IV 14%, REM 14% of  total sleep time.  Sleep latency 36 minutes, REM latency 251 minutes,  awake after sleep onset 96 minutes, arousal index 26.  An over-the-  counter sleep aid was taken at 9:20 p.m.   RESPIRATORY DATA:  Apnea/hypopnea index (AHI, RDI) 35.2 obstructive  events per hour indicating moderate obstructive sleep apnea/hypopnea  syndrome.  There were 28 obstructive apneas and 156 hypopneas. Events  were not positional.  REM AHI 24.3.  Significant events became apparent  only after midnight, and there was insufficient time by protocol to  trigger use of CPAP titration by split protocol on this study night.   OXYGEN DATA:  Mild to moderate snoring with oxygen desaturation to a  nadir of 84%.  Mean oxygen saturation through the study was 95% on room  air.   CARDIAC DATA:  Normal sinus rhythm.   MOVEMENT-PARASOMNIA:  Occasional limb jerk, insignificant.   IMPRESSIONS-RECOMMENDATIONS:  1. Moderate obstructive sleep apnea/hypopnea syndrome, AHI 35.2 per      hour with nonpositional events, moderate snoring, and oxygen      desaturation to a nadir of 84%.  2. Lack of early events prevented CPAP titration by split protocol on      this study night.  Consider return for CPAP titration or evaluate   for alternative therapies as appropriate.      Clinton D. Maple Hudson, MD, [facility], FACP  Diplomate, Biomedical engineer of Sleep Medicine  Electronically Signed     CDY/MEDQ  D:  08/22/2006 14:03:28  T:  08/22/2006 19:24:27  Job:  [id number]

## 2010-11-28 NOTE — Discharge Summary (Signed)
[name], Sonya Berry               ACCOUNT NO.:  [id number]   MEDICAL RECORD NO.:  [id number]          PATIENT TYPE:  INP   LOCATION:  9315                          [facility]   PHYSICIAN:  Tanya S. Shawnie Pons, M.D.   DATE OF BIRTH:  August 17, 1970   DATE OF ADMISSION:  11/11/2004  DATE OF DISCHARGE:  11/13/2004                                 DISCHARGE SUMMARY   FINAL DIAGNOSES:  1.  Pelvic mass.  2.  Infertility.  3.  Pelvic pain.   PERTINENT PROCEDURES:  The patient underwent an exploratory laparotomy, a  lysis of adhesions, removal of right adnexal mass, and ovarian biopsy.   PERTINENT LABORATORY VALUES:  Preoperative hemoglobin 13.9, postoperative  hemoglobin of 12.2.  Urine pregnancy test negative.   REASON FOR HOSPITALIZATION:  Briefly, the patient is a 40 year old gravida  1, para 1 who has chronic pelvic pain and right adnexal mass and secondary  infertility.  The patient desires surgical treatment of this.   HOSPITAL COURSE:  The patient was admitted on the day of surgery.  She  underwent a diagnostic scope and was found to have a 7 cm pelvic mass and  the scope was converted to a laparotomy.  During the procedure, the patient  had adhesions lysed and the pelvic mass excised.  Frozen section revealed  benign pathology.  Postoperatively, the patient was sent to the third floor  where she did well.  She had no Foley, was ambulate to the bathroom.  She  also had good bowel sounds.  On postoperative day #1, she was tolerating a  regular diet.  She was ambulating, had positive flatus.  Her IV fluids were  discontinued and she was switched to p.o. pain medicine which she tolerated  well and was discharged home on postoperative day #2.   DISCHARGE DISPOSITION AND CONDITION:  The patient is discharged home in good  condition.   DISCHARGE INSTRUCTIONS:  No heavy lifting x6 weeks.  No driving x1 week.  Follow up will be in GYN Clinic Monday for staple removal and drain removal  and in 2 weeks for postoperative check.   DISCHARGE MEDICATIONS:  1.  Percocet 5/325 one p.o. q.4-6h. p.r.n. pain.  2.  Ibuprofen 600 mg one p.o. q.6h. p.r.n. pain with food.   The patient is also instructed to return with temperature greater than 101,  persistent nausea and vomiting, or persistent pain.    TSP/MEDQ  D:  11/13/2004  T:  11/13/2004  Job:  [id number]

## 2010-11-28 NOTE — Op Note (Signed)
Sonya Berry, Sonya Berry               ACCOUNT NO.:  [id number]   MEDICAL RECORD NO.:  [id number]          PATIENT TYPE:  INP   LOCATION:  9315                          FACILITY:  [facility]   PHYSICIAN:  Tanya S. Shawnie Pons, M.D.   DATE OF BIRTH:  06-Nov-1970   DATE OF PROCEDURE:  11/11/2004  DATE OF DISCHARGE:                                 OPERATIVE REPORT   PREOPERATIVE DIAGNOSIS:  Right pelvic mass, pelvic pain, infertility.   POSTOPERATIVE DIAGNOSIS:  Right pelvic mass, pelvic pain, infertility.   PROCEDURE:  Diagnostic scope converted to exploratory laparotomy, removal of  right adnexal mass, ovarian biopsy, and lysis of adhesions.   SURGEON:  Shelbie Proctor. Shawnie Pons, M.D.   ASSISTANT:  None.   ANESTHESIA:  General by Quillian Quince, M.D.   SPECIMENS:  Right adnexal mass, biopsy of the right ovary.   ESTIMATED BLOOD LOSS:  100 mL.   COMPLICATIONS:  None.   FINDINGS:  6 x 4 cm cystic lesion in the right mesosalpinx and adhesions of  the tubes and ovaries bilaterally.   REASON FOR PROCEDURE:  The patient is a 40 year old gravida 2, para 1, who  has suffered infertility for the past 9 years.  She came in complaining of  right pelvic pain and was found to have a cystic lesion in the right adnexa  felt to be possibly a hydrosalpinx.  The patient was counseled on the risks  and benefits of this procedure and agreed to proceed.   DESCRIPTION OF PROCEDURE:  The patient was taken to the OR and she was  placed in the dorsal lithotomy position in Luther stirrups.  She was prepped  and draped in the usual sterile fashion.  Red rubber catheter was used to  drain her bladder.  A speculum was placed inside the vagina.  The cervix was  grasped with a single tooth tenaculum anteriorly and a Hulka tenaculum  passed through the cervix and used to grasp the anterior lip.  The single  tooth tenaculum and speculum were removed from the vagina.  Attention was  turned to the abdomen.  The umbilicus was  infiltrated with 5 mL of 0.25%  Marcaine.  A knife was used to make an incision there to the underlying  fascia and peritoneal cavity entered sharply.  An Hasson trocar was placed  through this incision and the abdomen insufflated.  The patient was placed  in Trendelenburg.  The uterus was elevated.  The tubes and ovaries were  noted to have some filmy adhesions to the pelvic side wall and to each other  bilaterally.  No other significant pathology was seen until the posterior  cul-de-sac was inspected.  There was a large cystic mass there which could  not be brought up any further into the field.  A 5 mm port was placed in the  right lower abdomen under direct visualization.  A grasper was then used to  try to elevate the cystic mass, however, could not be easily grasped and  brought up where any kind of inspection nor visualization of the ureter or  other blood  supply problems could easily be assessed.  For that reason, the  decision was made to convert to an open laparotomy.  All instruments were  then removed from the abdominal cavity.  The umbilical incision was closed  using two 0 Vicryl sutures on the UR6 that previously had been placed in the  fascia closed with two figure-of-eights.  The skin was closed with 4-0  Vicryl in a subcuticular fashion.  A knife was then used to make a  Pfannenstiel incision.  This was carried down to underlying fascia which was  incised in the midline and the incision extended laterally.  Kocher clamps  were then used to elevate the inferior and superior edges of the fascia off  of the underlying rectus.  The adhesions here were taken down bluntly,  laterally, and sharply in the midline.  The peritoneal cavity was then  entered sharply in the midline with the incision extended sharply as well.  The uterus was felt and the right adnexa was brought up into the surgical  field.  The tube was adherent to a large cystic lesion, however, the tube  itself  looked fairly normal.  These adhesions were taken down, the tube was  saved, any bleeders cauterized on the edge of the tube.  A Kelly placed  across the base of the cyst and the cyst removed and passed off for frozen  section.  The ovary felt especially firm, but otherwise was normal size and  shape.  There were no cystic lesions here.  The wedge biopsy was taken of  the ovary and sent to frozen pathology as well.  A 4-0 Vicryl suture was  then used to close the wedge defect of the ovary in a locked running  fashion.  Good hemostasis was noted here.  The tube also had good hemostasis  as well.  The Kelly clamp that had previously been about the cystic mass was  suture ligated with a 0 Vicryl suture in a Heaney type fashion.  The right  adnexa was completely hemostatic and returned to the abdomen.  Attention was  then turned to the left adnexa where some filmy adhesions had been noted  earlier.  These were taken down with sharp dissection.  Good hemostasis was  noted here too.  The fascia was then closed with a 0 Vicryl suture in a  running fashion.  A 7 inch flat JP drain was then placed through the  incision and brought out through the 5 mm port in the right lower quadrant.  Subcutaneous tissue was very thick and was closed with a Vicryl suture in a  running fashion.  The skin closed using clips.  All needle, sponge, and  instrument counts correct x2.  The patient was awakened and taken to the  recovery room in stable condition.  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      TSP/MEDQ  D:  11/11/2004  T:  11/11/2004  Job:  [id number]

## 2010-11-28 NOTE — Group Therapy Note (Signed)
[name], [name] NO.:  [id number]   MEDICAL RECORD NO.:  [id number]          PATIENT TYPE:  WOC   LOCATION:  [facility]                   FACILITY:  [facility]   PHYSICIAN:  Tinnie Gens, MD        DATE OF BIRTH:  [birth year]   DATE OF SERVICE:  07/24/2004                                    CLINIC NOTE   CHIEF COMPLAINT:  Abnormal periods, abdominal pain, infertility.   HISTORY OF PRESENT ILLNESS:  Patient is a 40 year old gravida 1 para 1-0-0-1  who has previously been seen in this clinic, last time was in 2002.  She has  had numerous workups for infertility including semen analysis which was  initially thought abnormal and then called normal by urologist, a HSG that  supposedly had patent bilateral tubes however, the right one was slower to  fill with a pelvic ultrasound that did show a possible right hydrosalpinx.  Patient does have regular cycles.  Patient has seen doctor in [place] who  put her on Clomid for a trial which she was on for approximately 6 months  with no followup and no timed intercourse with basal body temperature  testing.  The patient still has not gotten pregnant again and desires  pregnancy.   The patient is also complaining today of some abdominal pain especially  around her periods but it appears that she has complained of this before and  she reports that the pain goes away after that.  Patient reports that she  had two periods in December although she normally has regular cycles.  Patient is also complaining of some spotting after intercourse which on  further questioning seems to be only several days after her period.   PAST MEDICAL HISTORY:  Significant for GERD.  Patient has depression.   PAST SURGICAL HISTORY:  She had tendon repair left ankle, nose surgery and  tonsillectomy.   MEDICATION:  Prilosec OTC once daily.   ALLERGIES:  CODEINE.   OBSTETRICAL HISTORY:  G1, P1, one SVD.   GYNECOLOGIC HISTORY:  Menarche at age 85,  cycles every month, lasts 5 days,  mild pain with period, moderate to heavy flow, no contraception for the last  7 years.  She has no history of abnormal Pap smears, her last Pap smear that  she knows of was March 2004.   FAMILY HISTORY:  Diabetes, coronary artery disease, hypertension, colon and  breast cancer.   SOCIAL HISTORY:  Patient does smoke one pack per day for the last 15 years.  She denies alcohol or drug use.  She has been out of work for 5 years  secondary to depression.   REVIEW OF SYSTEMS:  Fourteen-point review of systems reviewed and is  positive for __________ dizzy spells, cough, nausea with vomiting sometimes,  also urine with coughing or sneezing, vaginal bleeding and pain with  intercourse on occasion.  All other review of systems is negative.   IMPRESSION:  1.  Infertility (patient's first pregnancy was achieved with a different      partner) with apparently patent left tube, possible right hydrosalpinx,  and apparent normal ovulation.  The various options have been discussed      with patient and she declines any kind of surgery laparoscopic or      otherwise to evaluate this further as she has no insurance and no money.  2.  Some dysmenorrhea secondary.  3.  Gynecological exam with Pap smear.  4.  Some irregular cycles.   PLAN:  1.  Check TSH today, rule out diabetes.  2.  Pap smear today.  3.  Start Clomid 50 mg p.o. once daily day #5-9.  4.  Basal body temperature with timed intercourse.  5.  Follow up her infertility in a month, if she has not achieved pregnancy      at that time we will increase the dosage of Clomid.  6.  Check UA secondary to some urinary frequency.      TP/MEDQ  D:  07/24/2004  T:  07/24/2004  Job:  [id number]

## 2010-11-28 NOTE — Procedures (Signed)
NAMEJAQUASHA, CARNEVALE NO.:  [id number]   MEDICAL RECORD NO.:  [id number]          PATIENT TYPE:  OUT   LOCATION:  [facility]                 FACILITY:  [facility]   PHYSICIAN:  Clinton D. Maple Hudson, MD, FCCP, FACPDATE OF BIRTH:  16-Sep-1970   DATE OF STUDY:                            NOCTURNAL POLYSOMNOGRAM   INDICATION FOR STUDY:  Hypersomnia with sleep apnea.   EPWORTH SLEEPINESS SCORE:  12/24, BMI 39.1, weight 235 pounds.   MEDICATIONS:  Home medications are listed and reviewed.   A diagnostic NPSG on July 19, 2006, recorded an AHI of 35.2 per hour.  CPAP titration is requested.   SLEEP ARCHITECTURE:  Total sleep time 262 minutes with sleep efficiency  72%.  Stage I was 8%, stage II 66%, stages III and IV 14%, REM 11% of  total sleep time.  Sleep latency 51 minutes, REM latency 64 minutes,  awake after sleep onset 42 minutes, arousal index 24.3.  Zolpidem was  taken at 9:55 p.m.  Sleep onset was at 11:07 p.m.   RESPIRATORY DATA:  CPAP titration protocol.  CPAP was titrated to 11  CWP, AHI 3.2 per hour.  A medium ResMed Quattro full face mask was used  with heated humidifier.   OXYGEN DATA:  CPAP prevented snoring and maintained oxygen saturation at  93% on room air.   CARDIAC DATA:  Normal sinus rhythm.   MOVEMENT-PARASOMNIA:  A total of 29 limb jerks were recorded of which 23  were associated with arousal or awakening for a periodic limb movement  with arousal index of 5.3 per hour.   IMPRESSIONS-RECOMMENDATIONS:  1. Successful continuous positive airway pressure titration to 11 CWP,      apnea-hypopnea index 3.2 per hour.  A medium ResMed Quattro full      face mask was used with heated humidifier.  2. Baseline diagnostic nocturnal polysomnogram on July 19, 2006, had      recorded an apnea-hypopnea index of 35.2 per hour.  3. Mild periodic limb movement with arousal, 5.3 per hour.  This may      reflect the effects of continuous positive      airway  pressure titration, but if the pattern persists there may be      some benefit to a trial of specific therapy such as Requip or      Mirapex if appropriate.      Clinton D. Maple Hudson, MD, [facility], FACP  Diplomate, Biomedical engineer of Sleep Medicine  Electronically Signed     CDY/MEDQ  D:  09/05/2006 09:41:48  T:  09/05/2006 17:50:19  Job:  [id number]

## 2010-11-28 NOTE — Group Therapy Note (Signed)
Sonya Berry, Sonya Berry NO.:  [id number]   MEDICAL RECORD NO.:  [id number]          PATIENT TYPE:  WOC   LOCATION:  [facility]                   FACILITY:  [facility]   PHYSICIAN:  Tinnie Gens, MD        DATE OF BIRTH:  Nov 03, 1970   DATE OF SERVICE:  11/05/2005                                    CLINIC NOTE   CHIEF COMPLAINT:  Follow-up.   HISTORY OF PRESENT ILLNESS:  Patient is a 40 year old gravida 1, para 1 who  has previously been seen and follows up for abnormal Pap smear, obesity,  hypertension.  She had a surgery for cyst removal in the past and for  infertility.  Patient was supposed to come in in January for another Pap  smear and has not followed up for a Pap since July of last year.  Her last  colpo biopsy was CIN I.  Patient reports having regular cycles and no real  issues; however, she has questions today about her infertility and the fact  she never had a postcoital test done.  Otherwise, the patient is without  significant complaints.   PAST MEDICAL HISTORY:  1.  High blood pressure.  2.  Acid reflux.   SURGERIES:  1.  Tendon repair of the left ankle.  2.  Nose surgery.  3.  Tonsillectomy.  4.  She had a recent Achilles' tendon rupture.   MEDICATIONS:  Prilosec, hydrocodone, Claritin.   ALLERGIES:  CODEINE, CELEBREX, VIOXX.   OB HISTORY:  She is a G1, P1 with one previous vaginal delivery.   GYN HISTORY:  Menarche age 59.  Cycles are regular, come once a month, last  five days.  She is on no birth control.  She is trying to get pregnant.  She  does have a history of abnormal Pap smear including CIN I by colpo biopsy  x2.   FAMILY HISTORY:  Diabetes, heart disease, hypertension, history of colon and  breast cancer in the distant family.   SOCIAL HISTORY:  She does smoke.  She has been out of work.  No alcohol or  drug use.   14-point review of systems is reviewed.  The patient denies headache, vision  changes, shortness of breath, chest  pain, abdominal pain, nausea, vomiting,  diarrhea, constipation, dysuria, lower extremity swelling, numbness  ___________ fingers or in extremities except as in HPI.   PHYSICAL EXAMINATION:  VITAL SIGNS:  Weight 238 pounds, blood pressure  140/94.  Her pulse is 111.  GENERAL:  She is an obese black female in no acute distress.  ABDOMEN:  Soft, nontender, nondistended.  GENITOURINARY:  Normal external female genitalia.  BUS is normal.  Vagina is  pink and rugated.  Cervix is parous and without lesion.  The uterus and  adnexa cannot be well appreciated secondary to patient's body habitus;  however, no significant abnormalities or tenderness was found.   IMPRESSION:  1.  History of CIN I.  2.  Obesity.  3.  Hypertension.  4.  Questionable glucose intolerance.  5.  Infertility.   PLAN:  1.  Pap smear today.  2.  75 g OGT to rule out diabetes.  3.  Repeat semen analysis.  4.  Continue weight loss.  Did advise her about exercise and weightbearing      activity.  She will follow up in three months.           ______________________________  Tinnie Gens, MD     TP/MEDQ  D:  11/05/2005  T:  11/06/2005  Job:  [id number]

## 2010-11-28 NOTE — Group Therapy Note (Signed)
NAMENYLAN, NAKATANI NO.:  [id number]   MEDICAL RECORD NO.:  [id number]          PATIENT TYPE:  WOC   LOCATION:  [facility]                   FACILITY:  [facility]   PHYSICIAN:  Argentina Donovan, MD        DATE OF BIRTH:  [birth year]   DATE OF SERVICE:  02/04/2006                                    CLINIC NOTE   The patient is a 40 year old gravida 1, para 1-0-0-1, with previous partner,  who has not gotten pregnant when trying over a period of 9 years.  She mild  hypertension treated with hydrochlorothiazide.  She is obese.  Has a history  of CIN-I Pap smear.  Had an ovarian cyst removed within the last few years,  thinking that was __________ her fertility and a recent sperm evaluation  showed a high count; however, there was 91% with abnormal heads, so I am  referring the patient's husband to Dr. McDiarmid for evaluation.  She has  recently been treated for upper respiratory infections with amoxicillin and  has developed a vulvar itch.  A wet prep was done today and the patient was  given a prescription of Diflucan plus Mycolog II cream.   IMPRESSION:  1.  Monilial vaginitis.  2.  Primary infertility.           ______________________________  Argentina Donovan, MD     PR/MEDQ  D:  02/04/2006  T:  02/04/2006  Job:  [id number]

## 2010-11-28 NOTE — Group Therapy Note (Signed)
Sonya Berry, CANTER NO.:  [id number]   MEDICAL RECORD NO.:  [id number]          PATIENT TYPE:  WOC   LOCATION:  [facility]                   FACILITY:  [facility]   PHYSICIAN:  Argentina Donovan, MD        DATE OF BIRTH:  1971/02/25   DATE OF SERVICE:  09/16/2004                                    CLINIC NOTE   The patient is a 40 year old gravida 1, para 1-0-0-1 with a child age 73 who  has had long-term infertility, hysterosalpingogram over the past year that  showed bilateral tubal patency.  Has been bothered by pelvic pain, crampy in  nature and suprapubic in location, although mainly on the right.  It does  not seem to change much with her period, although she does have it  throughout the period and over the past two months she has had  intermenstrual bleeding continually.  She has had to take a lot of time off  from work because the pain has been incapacitating and we have talked to the  patient about laparoscopic examination with hysteroscopic and a D&C and that  possibly we might have to do a laparotomy.  We will try and be as  conservative as possible, although she might lose an ovary because the  ultrasound showing either hydrosalpinx or tubo-ovarian abscess on the right  side.  The patient as far as medication, takes only Prilosec for reflux and  has been on Naprosyn and Darvocet for the pelvic pain.   PAST MEDICAL HISTORY:  Significant for GERD.  She has an old history of  depression.   PAST SURGICAL HISTORY:  She has had a left ankle repair and nose surgery  with tonsillectomy.   ALLERGIES:  CODEINE.   PAST GYNECOLOGICAL HISTORY:  She had menarche usual time, age 63 with normal  cycles and mild cramping, but no significant pelvic pain until recently.   FAMILY HISTORY:  Coronary artery disease, hypertension, breast cancer.   SOCIAL HISTORY:  She smokes a pack or more per day.  She does not take  excessive alcohol or take any illicit drugs.   REVIEW OF  SYSTEMS:  Negative except for the present illness and occasional  bouts of stress incontinence.   IMPRESSION:  1.  Chronic pelvic pain.  2.  Right adnexal mass.  3.  Secondary infertility.  4.  In addition, recent abnormal Pap smear with CIN I.  The patient has been      informed to get a Pap smear every six months.      PR/MEDQ  D:  09/16/2004  T:  09/16/2004  Job:  [id number]

## 2010-12-09 ENCOUNTER — Inpatient Hospital Stay (INDEPENDENT_AMBULATORY_CARE_PROVIDER_SITE_OTHER)
Admission: RE | Admit: 2010-12-09 | Discharge: 2010-12-09 | Disposition: A | Discharge status: Home / Self Care | Payer: BC Managed Care – PPO | Source: Ambulatory Visit | Attending: Family Medicine | Admitting: Family Medicine

## 2010-12-09 ENCOUNTER — Ambulatory Visit (INDEPENDENT_AMBULATORY_CARE_PROVIDER_SITE_OTHER): Payer: BC Managed Care – PPO

## 2010-12-09 DIAGNOSIS — R059 Cough, unspecified: Secondary | ICD-10-CM

## 2010-12-09 DIAGNOSIS — J40 Bronchitis, not specified as acute or chronic: Secondary | ICD-10-CM

## 2010-12-09 DIAGNOSIS — R05 Cough: Secondary | ICD-10-CM

## 2010-12-11 ENCOUNTER — Emergency Department (HOSPITAL_COMMUNITY)
Admission: EM | Admit: 2010-12-11 | Discharge: 2010-12-11 | Disposition: A | Discharge status: Home / Self Care | Payer: BC Managed Care – PPO | Attending: Emergency Medicine | Admitting: Emergency Medicine

## 2010-12-11 DIAGNOSIS — Z79899 Other long term (current) drug therapy: Secondary | ICD-10-CM | POA: Insufficient documentation

## 2010-12-11 DIAGNOSIS — K219 Gastro-esophageal reflux disease without esophagitis: Secondary | ICD-10-CM | POA: Insufficient documentation

## 2010-12-11 DIAGNOSIS — G8929 Other chronic pain: Secondary | ICD-10-CM | POA: Insufficient documentation

## 2010-12-11 DIAGNOSIS — J45909 Unspecified asthma, uncomplicated: Secondary | ICD-10-CM | POA: Insufficient documentation

## 2010-12-11 DIAGNOSIS — L509 Urticaria, unspecified: Secondary | ICD-10-CM | POA: Insufficient documentation

## 2010-12-24 ENCOUNTER — Other Ambulatory Visit (HOSPITAL_COMMUNITY): Payer: Self-pay | Admitting: Internal Medicine

## 2010-12-24 DIAGNOSIS — M546 Pain in thoracic spine: Secondary | ICD-10-CM

## 2010-12-24 DIAGNOSIS — M545 Low back pain, unspecified: Secondary | ICD-10-CM

## 2010-12-26 ENCOUNTER — Ambulatory Visit (HOSPITAL_COMMUNITY)
Admission: RE | Admit: 2010-12-26 | Discharge: 2010-12-26 | Disposition: A | Discharge status: Home / Self Care | Payer: BC Managed Care – PPO | Source: Ambulatory Visit | Attending: Internal Medicine | Admitting: Internal Medicine

## 2010-12-26 DIAGNOSIS — M546 Pain in thoracic spine: Secondary | ICD-10-CM

## 2010-12-26 DIAGNOSIS — M545 Low back pain, unspecified: Secondary | ICD-10-CM

## 2010-12-26 DIAGNOSIS — M5124 Other intervertebral disc displacement, thoracic region: Secondary | ICD-10-CM | POA: Insufficient documentation

## 2011-04-03 LAB — WET PREP, GENITAL
Clue Cells Wet Prep HPF POC: NONE SEEN
Clue Cells Wet Prep HPF POC: NONE SEEN
Trich, Wet Prep: NONE SEEN
Trich, Wet Prep: NONE SEEN
Yeast Wet Prep HPF POC: NONE SEEN
Yeast Wet Prep HPF POC: NONE SEEN

## 2011-04-03 LAB — HCG, QUANTITATIVE, PREGNANCY: hCG, Beta Chain, Quant, S: 285 — ABNORMAL HIGH

## 2011-04-03 LAB — CBC
HCT: 39.8
Hemoglobin: 12.9
MCV: 89.2
RBC: 4.47
RBC: 4.28
WBC: 14.2 — ABNORMAL HIGH
WBC: 12 — ABNORMAL HIGH

## 2011-04-03 LAB — GC/CHLAMYDIA PROBE AMP, GENITAL
Chlamydia, DNA Probe: NEGATIVE
GC Probe Amp, Genital: NEGATIVE

## 2011-04-03 LAB — POCT PREGNANCY, URINE: Operator id: 11741

## 2011-04-20 LAB — BASIC METABOLIC PANEL
BUN: 5 — ABNORMAL LOW
Calcium: 9.8
Chloride: 98
Creatinine, Ser: 0.66
GFR calc Af Amer: >60

## 2011-04-20 LAB — POCT HEMOGLOBIN-HEMACUE
Hemoglobin: 13.9
Operator id: 208731

## 2011-06-13 ENCOUNTER — Emergency Department (HOSPITAL_COMMUNITY): Payer: BC Managed Care – PPO

## 2011-06-13 ENCOUNTER — Emergency Department (HOSPITAL_COMMUNITY)
Admission: EM | Admit: 2011-06-13 | Discharge: 2011-06-13 | Disposition: A | Discharge status: Home / Self Care | Payer: BC Managed Care – PPO | Attending: Emergency Medicine | Admitting: Emergency Medicine

## 2011-06-13 ENCOUNTER — Encounter: Payer: Self-pay | Admitting: *Deleted

## 2011-06-13 DIAGNOSIS — F410 Panic disorder [episodic paroxysmal anxiety] without agoraphobia: Secondary | ICD-10-CM

## 2011-06-13 DIAGNOSIS — R209 Unspecified disturbances of skin sensation: Secondary | ICD-10-CM | POA: Insufficient documentation

## 2011-06-13 DIAGNOSIS — H538 Other visual disturbances: Secondary | ICD-10-CM | POA: Insufficient documentation

## 2011-06-13 HISTORY — DX: Anxiety disorder, unspecified: F41.9

## 2011-06-13 LAB — BASIC METABOLIC PANEL
BUN: 6 mg/dL (ref 6–23)
Calcium: 9.7 mg/dL (ref 8.4–10.5)
GFR calc Af Amer: >90 mL/min (ref >90)
GFR calc non Af Amer: >90 mL/min (ref >90)
Glucose, Bld: 89 mg/dL (ref 70–99)

## 2011-06-13 LAB — DIFFERENTIAL
Basophils Absolute: 0.1 10*3/uL (ref 0.0–0.1)
Basophils Relative: 1 % (ref 0–1)
Eosinophils Absolute: 0 10*3/uL (ref 0.0–0.7)
Lymphocytes Relative: 20 % (ref 12–46)
Monocytes Absolute: 0.6 10*3/uL (ref 0.1–1.0)
Neutro Abs: 8.4 10*3/uL — ABNORMAL HIGH (ref 1.7–7.7)
Neutrophils Relative %: 74 % (ref 43–77)

## 2011-06-13 LAB — CBC
HCT: 33.3 % — ABNORMAL LOW (ref 36.0–46.0)
Hemoglobin: 9.8 g/dL — ABNORMAL LOW (ref 12.0–15.0)
MCH: 20.9 pg — ABNORMAL LOW (ref 26.0–34.0)
MCHC: 29.4 g/dL — ABNORMAL LOW (ref 30.0–36.0)
RDW: 18.1 % — ABNORMAL HIGH (ref 11.5–15.5)

## 2011-06-13 MED ORDER — ALPRAZOLAM 0.25 MG PO TABS: 0.2500 mg | ORAL_TABLET | ORAL | Status: DC | PRN

## 2011-06-13 MED ORDER — ACETAMINOPHEN 325 MG PO TABS
650.0000 mg | ORAL_TABLET | Freq: Once | ORAL | Status: AC
  Administered 2011-06-13: 650 mg via ORAL
  Filled 2011-06-13: qty 2

## 2011-06-13 NOTE — ED Notes (Signed)
Pt requested parents to leave with children and husband to leave hospital--pt reports she and husband are facing relationship difficulties; pt later regretted decision to ask family to leave and agreed with plan to wait until discharge to work on relationship issues

## 2011-06-13 NOTE — Progress Notes (Signed)
Chaplain's Note: Chaplain was paged for pt's husband.  Husband could not be located when chaplain arrived.  Pt was distressed and emotional about her relationship with her husband.  Chaplain provided emotional support to pt   06/13/11 1815  Clinical Encounter Type  Visited With Patient and family together  Visit Type Psychological support  Referral From Nurse  Spiritual Encounters  Spiritual Needs Emotional  Stress Factors  Patient Stress Factors Family relationships  Family Stress Factors Family relationships   and recommended marriage counseling.  Please page if needed. [employer]  [phone]

## 2011-06-13 NOTE — ED Provider Notes (Signed)
History     CSN: [id number] Arrival date & time: 06/13/2011  3:25 PM   First MD Initiated Contact with Patient 06/13/11 1546      Chief Complaint  Patient presents with  . Panic Attack  (Consider location/radiation/quality/duration/timing/severity/associated sxs/prior treatment) HPI Patient states she was having an argument with her husband and she got really upset. Following that incident she began having trouble with blurred vision where she felt like images were somewhat distorted on the left field of vision as well as a funny sensation in her left hand. She states it is hard to explain it was not that her hand was numb or weak but felt like she had to concentrate in order to move her left upper extremity. Patient called 911. EMS arrived she was hyperventilating and had her take one of her Xanax. After doing that her symptoms have subsequently resolved. Patient states that they lasted maybe 15 minutes. Subsequently all the symptoms have resolved. Patient denies any problems with any chest pain or shortness of breath. She has no history of strokes. She denies any trouble with her speech or her balance or coordination. Past Medical History  Diagnosis Date  . Anxiety     Past Surgical History  Procedure Date  . Bilateral ankle surgery     History reviewed. No pertinent family history.  History  Substance Use Topics  . Smoking status: Current Everyday Smoker -- 1.0 packs/day  . Smokeless tobacco: Not on file  . Alcohol Use: No    OB History    Grav Para Term Preterm Abortions TAB SAB Ect Mult Living                  Review of Systems  All other systems reviewed and are negative.    Allergies  Codeine; Diclofenac sodium; Escitalopram oxalate; Rofecoxib; and Tramadol hcl  Home Medications   Current Outpatient Rx  Name Route Sig Dispense Refill  . ALPRAZOLAM 0.25 MG PO TABS Oral Take 0.25 mg by mouth at bedtime as needed. anxiety    . CODEINE SULFATE 15 MG PO TABS Oral  Take 15 mg by mouth every 6 (six) hours as needed.      . OXYCODONE-ACETAMINOPHEN 10-325 MG PO TABS Oral Take 1 tablet by mouth every 4 (four) hours as needed. Pain.      BP 137/80  Pulse 116  Temp(Src) 98.2 F (36.8 C) (Oral)  Resp 14  SpO2 100%  LMP 05/18/2011  Physical Exam  Nursing note and vitals reviewed. Constitutional: She is oriented to person, place, and time. She appears well-developed and well-nourished. No distress.  HENT:  Head: Normocephalic and atraumatic.  Right Ear: External ear normal.  Left Ear: External ear normal.  Mouth/Throat: Oropharynx is clear and moist.  Eyes: Conjunctivae are normal. Right eye exhibits no discharge. Left eye exhibits no discharge. No scleral icterus.  Neck: Neck supple. No tracheal deviation present.  Cardiovascular: Normal rate, regular rhythm and intact distal pulses.   Pulmonary/Chest: Effort normal and breath sounds normal. No stridor. No respiratory distress. She has no wheezes. She has no rales.  Abdominal: Soft. Bowel sounds are normal. She exhibits no distension. There is no tenderness. There is no rebound and no guarding.  Musculoskeletal: She exhibits no edema and no tenderness.  Neurological: She is alert and oriented to person, place, and time. She has normal strength. No cranial nerve deficit ( no gross defecits noted) or sensory deficit. She exhibits normal muscle tone. She displays no seizure activity.  Coordination normal.       No pronator drift bilateral upper extrem, able to hold both legs off bed for 5 seconds, sensation intact in all extremities, no visual field cuts, no left or right sided neglect  Skin: Skin is warm and dry. No rash noted.  Psychiatric: She has a normal mood and affect.    ED Course  Procedures (including critical care time) Normal sinus rhythm with a rate of 100 noted on the monitor. Labs Reviewed  CBC - Abnormal; Notable for the following:    WBC 11.4 (*)    Hemoglobin 9.8 (*)    HCT 33.3 (*)     MCV 71.2 (*)    MCH 20.9 (*)    MCHC 29.4 (*)    RDW 18.1 (*)    All other components within normal limits  DIFFERENTIAL - Abnormal; Notable for the following:    Neutro Abs 8.4 (*)    All other components within normal limits  BASIC METABOLIC PANEL   Ct Head Wo Contrast  06/13/2011  *RADIOLOGY REPORT*  Clinical Data: Blurred vision and numbness and left hand  CT HEAD WITHOUT CONTRAST  Technique:  Contiguous axial images were obtained from the base of the skull through the vertex without contrast.  Comparison: May 12, 2007  Findings: The ventricles are normal in size, shape, and position. There is no mass effect or midline shift.  No acute hemorrhage or abnormal extra-axial fluid collections are identified.  The gray/white differentiation is normal.  The orbits, calvarium, visualized paranasal sinuses have a normal appearance.  IMPRESSION: Normal CT scan of the head with no evidence of acute intracranial abnormality.  Original Report Authenticated By: Brandon Melnick, M.D.    Diagnosis: Panic attack   MDM  Patient had an episode of anxiety/panic attack that seemed to bring on some symptoms of visual disturbance. Overall I doubt TIA stroke. Patient feels well at this time. No focal deficits are noted. Find it unlikely that she had a TIA and that the symptoms seem to come on it immediately after a stressful situation, she had not taken her medications for the day, and it got better after she took her anxiolytic. Patient will be discharged home to follow up with her primary care doctor.        Celene Kras, MD 06/13/11 [phone]

## 2011-06-13 NOTE — ED Notes (Signed)
Pt reports plan to follow-up with PCP, pastoral care through community, and utilizing family resources. Family appears anxious also.

## 2011-06-13 NOTE — ED Notes (Signed)
Per EMS: pt was having an argument with husband and got really upset. Pt has a history of anxiety. Pt did not have any of her anxiety medications before incident, ptt took her anxiety medications before EMS transport to [facility]. Pt is sinus tach.

## 2011-06-13 NOTE — ED Notes (Signed)
ZOX:[id number]<AV> Expected date:06/13/11<BR> Expected time: 3:00 PM<BR> Means of arrival:Ambulance<BR> Comments:<BR> EMS 15 GC, 36 yof anxiety attack, hyperentilation

## 2012-01-07 ENCOUNTER — Ambulatory Visit: Payer: BC Managed Care – PPO | Admitting: Obstetrics and Gynecology

## 2012-01-28 ENCOUNTER — Ambulatory Visit: Payer: BC Managed Care – PPO | Admitting: Obstetrics & Gynecology

## 2012-12-30 ENCOUNTER — Emergency Department (HOSPITAL_COMMUNITY)
Admission: EM | Admit: 2012-12-30 | Discharge: 2012-12-30 | Disposition: A | Discharge status: Home / Self Care | Payer: Medicaid Other | Attending: Emergency Medicine | Admitting: Emergency Medicine

## 2012-12-30 ENCOUNTER — Emergency Department (HOSPITAL_COMMUNITY): Payer: Medicaid Other

## 2012-12-30 ENCOUNTER — Encounter (HOSPITAL_COMMUNITY): Payer: Self-pay | Admitting: *Deleted

## 2012-12-30 DIAGNOSIS — S92309A Fracture of unspecified metatarsal bone(s), unspecified foot, initial encounter for closed fracture: Secondary | ICD-10-CM | POA: Insufficient documentation

## 2012-12-30 DIAGNOSIS — S93409A Sprain of unspecified ligament of unspecified ankle, initial encounter: Secondary | ICD-10-CM | POA: Insufficient documentation

## 2012-12-30 DIAGNOSIS — F17200 Nicotine dependence, unspecified, uncomplicated: Secondary | ICD-10-CM | POA: Insufficient documentation

## 2012-12-30 DIAGNOSIS — S92302A Fracture of unspecified metatarsal bone(s), left foot, initial encounter for closed fracture: Secondary | ICD-10-CM

## 2012-12-30 DIAGNOSIS — Y9289 Other specified places as the place of occurrence of the external cause: Secondary | ICD-10-CM | POA: Insufficient documentation

## 2012-12-30 DIAGNOSIS — X500XXA Overexertion from strenuous movement or load, initial encounter: Secondary | ICD-10-CM | POA: Insufficient documentation

## 2012-12-30 DIAGNOSIS — G8929 Other chronic pain: Secondary | ICD-10-CM | POA: Insufficient documentation

## 2012-12-30 DIAGNOSIS — Z9889 Other specified postprocedural states: Secondary | ICD-10-CM | POA: Insufficient documentation

## 2012-12-30 DIAGNOSIS — Z79899 Other long term (current) drug therapy: Secondary | ICD-10-CM | POA: Insufficient documentation

## 2012-12-30 DIAGNOSIS — Y9389 Activity, other specified: Secondary | ICD-10-CM | POA: Insufficient documentation

## 2012-12-30 DIAGNOSIS — F411 Generalized anxiety disorder: Secondary | ICD-10-CM | POA: Insufficient documentation

## 2012-12-30 DIAGNOSIS — M549 Dorsalgia, unspecified: Secondary | ICD-10-CM | POA: Insufficient documentation

## 2012-12-30 HISTORY — DX: Other chronic pain: G89.29

## 2012-12-30 HISTORY — DX: Dorsalgia, unspecified: M54.9

## 2012-12-30 MED ORDER — OXYCODONE-ACETAMINOPHEN 5-325 MG PO TABS: 1.0000 | ORAL_TABLET | ORAL | Status: DC | PRN

## 2012-12-30 MED ORDER — OXYCODONE-ACETAMINOPHEN 5-325 MG PO TABS
2.0000 | ORAL_TABLET | Freq: Once | ORAL | Status: AC
  Administered 2012-12-30: 2 via ORAL
  Filled 2012-12-30: qty 2

## 2012-12-30 NOTE — ED Provider Notes (Signed)
History     CSN: [id number]  Arrival date & time 12/30/12  4098   First MD Initiated Contact with Patient 12/30/12 [phone]      Chief Complaint  Patient presents with  . Ankle Pain    (Consider location/radiation/quality/duration/timing/severity/associated sxs/prior treatment) Patient is a 42 y.o. female presenting with ankle pain. The history is provided by the patient.  Ankle Pain Location:  Ankle Time since incident:  1 day Injury: yes   Mechanism of injury comment:  Inversion injury Ankle location:  L ankle Pain details:    Quality:  Aching and throbbing   Radiates to:  Does not radiate   Severity:  Moderate   Onset quality:  Sudden   Timing:  Constant   Progression:  Worsening (She describes worse swelling and bruising since waking this morning) Chronicity:  New Dislocation: no   Relieved by:  Rest Worsened by:  Bearing weight Ineffective treatments:  Ice and rest Associated symptoms: swelling   Associated symptoms: no back pain, no muscle weakness and no numbness     Past Medical History  Diagnosis Date  . Anxiety   . Chronic back pain     Past Surgical History  Procedure Laterality Date  . Bilateral ankle surgery      No family history on file.  History  Substance Use Topics  . Smoking status: Current Every Day Smoker -- 1.00 packs/day  . Smokeless tobacco: Not on file  . Alcohol Use: No    OB History   Grav Para Term Preterm Abortions TAB SAB Ect Mult Living                  Review of Systems  Musculoskeletal: Positive for joint swelling and arthralgias. Negative for back pain.  Skin: Negative for wound.  Neurological: Negative for weakness and numbness.    Allergies  Gelatin; Diclofenac sodium; Escitalopram oxalate; Rofecoxib; and Tramadol hcl  Home Medications   Current Outpatient Rx  Name  Route  Sig  Dispense  Refill  . ALPRAZolam (XANAX) 0.25 MG tablet   Oral   Take 1 tablet (0.25 mg total) by mouth as needed.   12 tablet    0   . HYDROcodone-acetaminophen (NORCO) 7.5-325 MG per tablet   Oral   Take 1 tablet by mouth every 6 (six) hours as needed for pain.         Marland Kitchen lansoprazole (PREVACID) 15 MG capsule   Oral   Take 15 mg by mouth daily.         Marland Kitchen oxyCODONE-acetaminophen (PERCOCET/ROXICET) 5-325 MG per tablet   Oral   Take 1 tablet by mouth every 4 (four) hours as needed for pain.   24 tablet   0     BP 127/86  Pulse 81  Temp(Src) 98.3 F (36.8 C) (Oral)  Resp 16  Ht 5\' 2"  (1.575 m)  Wt 215 lb (97.523 kg)  BMI 39.31 kg/m2  SpO2 98%  LMP 12/18/2012  Physical Exam  Nursing note and vitals reviewed. Constitutional: She appears well-developed and well-nourished.  HENT:  Head: Normocephalic.  Cardiovascular: Normal rate and intact distal pulses.  Exam reveals no decreased pulses.   Pulses:      Dorsalis pedis pulses are 2+ on the right side, and 2+ on the left side.       Posterior tibial pulses are 2+ on the right side, and 2+ on the left side.  Musculoskeletal: She exhibits edema and tenderness.  Left ankle: She exhibits decreased range of motion, swelling and ecchymosis. She exhibits normal pulse. Tenderness. Lateral malleolus tenderness found. No head of 5th metatarsal and no proximal fibula tenderness found. Achilles tendon normal.  Neurological: She is alert. No sensory deficit.  Skin: Skin is warm, dry and intact.    ED Course  Procedures (including critical care time)  Labs Reviewed - No data to display Dg Ankle Complete Left  12/30/2012   *RADIOLOGY REPORT*  Clinical Data: Ankle pain post fall  LEFT ANKLE COMPLETE - 3+ VIEW  Comparison: 03/12/2007  Findings: Three views of the left ankle submitted.  There is mild displaced fracture at the tip of distal fibula with adjacent soft tissue swelling. Ankle mortise is preserved.  IMPRESSION: Mild displaced fracture at the tip of distal fibula with adjacent soft tissue swelling.  Ankle mortise is preserved.   Original Report  Authenticated By: Natasha Mead, M.D.   Dg Foot Complete Left  12/30/2012   *RADIOLOGY REPORT*  Clinical Data: Pain post fall  LEFT FOOT - COMPLETE 3+ VIEW  Comparison: None.  Findings: Three views of the left foot submitted.  There is nondisplaced fracture at the base of fifth metatarsal.  IMPRESSION: Nondisplaced fracture at the base of fifth metatarsal.   Original Report Authenticated By: Natasha Mead, M.D.     1. Metatarsal fracture, left, closed, initial encounter   2. Ankle sprain and strain, left, initial encounter       MDM  Patients labs and/or radiological studies were viewed and considered during the medical decision making and disposition process.  The patient was placed in a Cam Walker.  She states she cannot use crutches, therefore a walker was prescribed.  Also prescribed oxycodone.  Encouraged rest, ice, elevation and nonweightbearing until further evaluated by Dr. Romeo Apple.  Patient understands to call for an office followup with him.        Burgess Amor, PA-C 12/30/12 [phone]

## 2012-12-30 NOTE — ED Notes (Signed)
Pt states she fell on bottom step and heard a snap. Bruising and swelling to left ankle.

## 2012-12-30 NOTE — ED Provider Notes (Signed)
Medical screening examination/treatment/procedure(s) were performed by non-physician practitioner and as supervising physician I was immediately available for consultation/collaboration.  Shelda Jakes, MD 12/30/12 [phone]

## 2013-01-02 ENCOUNTER — Ambulatory Visit (INDEPENDENT_AMBULATORY_CARE_PROVIDER_SITE_OTHER): Payer: Medicaid Other | Admitting: Orthopedic Surgery

## 2013-01-02 VITALS — BP 164/92 | Ht 62.0 in | Wt 202.0 lb

## 2013-01-02 DIAGNOSIS — S93402A Sprain of unspecified ligament of left ankle, initial encounter: Secondary | ICD-10-CM

## 2013-01-02 DIAGNOSIS — S99929A Unspecified injury of unspecified foot, initial encounter: Secondary | ICD-10-CM

## 2013-01-02 DIAGNOSIS — S99912A Unspecified injury of left ankle, initial encounter: Secondary | ICD-10-CM

## 2013-01-02 DIAGNOSIS — S99919A Unspecified injury of unspecified ankle, initial encounter: Secondary | ICD-10-CM | POA: Insufficient documentation

## 2013-01-02 DIAGNOSIS — S93409A Sprain of unspecified ligament of unspecified ankle, initial encounter: Secondary | ICD-10-CM

## 2013-01-02 DIAGNOSIS — S8990XA Unspecified injury of unspecified lower leg, initial encounter: Secondary | ICD-10-CM

## 2013-01-02 MED ORDER — OXYCODONE-ACETAMINOPHEN 5-325 MG PO TABS: 1.0000 | ORAL_TABLET | ORAL | Status: DC | PRN

## 2013-01-02 NOTE — Patient Instructions (Signed)
Wear boot   Weight bear as tolerated   Pick up new prescription every 2 weeks   Recheck ankle in 6 weeks

## 2013-01-03 ENCOUNTER — Encounter: Payer: Self-pay | Admitting: Orthopedic Surgery

## 2013-01-03 NOTE — Progress Notes (Signed)
Patient ID: Sonya Berry, female   DOB: 02/28/1971, 42 y.o.   MRN: [id number] Chief Complaint  Patient presents with  . Ankle Pain    Left ankle fracture d/t injury 12/29/12    History: 42 year-old female with previous left ankle reconstruction via tenodesis presents after slipping off of a step and twisting her left ankle. She says she heard a loud pop. She went to the emergency room. X-rays show a small avulsion fracture of the lateral malleolus. Significant soft tissue swelling noted. She complains of lateral ankle pain, muscle spasms in the plantar aspect of the foot pain swelling decreased ankle range of motion. Denies numbness or tingling. Date of injury  12/29/2012.  Review of systems she complains of heartburn numbness and tingling unsteady gait dizziness and tremors nervousness and anxiety easy bruising with anemia denies chest pain or blurred vision.    The past, family history and social history have been reviewed and are recorded in the corresponding sections of epic   BP 164/92  Ht 5\' 2"  (1.575 m)  Wt 202 lb (91.627 kg)  BMI 36.94 kg/m2  LMP 12/18/2012 General appearance is normal, the patient is alert and oriented x3 with normal mood and affect. Body habitus mesomorphic The patient is ambulatory with assistive device in a cam walking boot  Inspection of the left ankle reveals a previous incision which is consistent with a peroneal tendon tenodesis and rerouting through the ankle and foot to reconstruct the chronically loose joint. There is significant edema and swelling and ecchymosis in the foot with tenderness over the lateral malleolus anterior foot dorsal foot medial ankle. The right lower extremity is nontender with no swelling  Range of motion is limited the foot is held in plantar flexion at approximately 10 dorsiflexion is limited but the patient can get the foot to neutral position.  Stability cannot be assessed secondary to pain swelling  Muscle tone was normal  strength tests could not be assessed  Skin is ecchymotic without blister rash or ulceration no laceration  Dorsalis pedis pulse is normal and intact with good capillary refill compartments of the foot are soft  Normal sensation is noted on the dorsum of the foot with soft touch pressure and position test  X-rays show small lateral avulsion distal fibula   Diagnosis despite this avulsion this is more of a soft tissue injury and will be treated as such. Cam Walker weightbearing as tolerated ice and elevation to control swelling. The patient says she is allergic to tramadol, Vicodin makes reviewed. She requested Percocet and was given a prescription for 84 tablets. She will have to come back every 2 weeks to get the medication refilled. After 6 weeks she will be switched to an anti-inflammatory and/or Tylenol. No exceptions.  Encounter Diagnosis  Name Primary?  Marland Kitchen Ankle injury, left, initial encounter Yes

## 2013-01-09 ENCOUNTER — Telehealth: Payer: Self-pay | Admitting: Orthopedic Surgery

## 2013-01-09 NOTE — Telephone Encounter (Signed)
Patient states she will take the hydrocodone. Which strength and how many do you want me to give her?

## 2013-01-09 NOTE — Telephone Encounter (Signed)
#   1 no more percocet   She can have hydrocodone  Or tylenol #4   Ask her which one no exceptions

## 2013-01-09 NOTE — Telephone Encounter (Signed)
Tammy, will you call this patient?  Thanks

## 2013-01-09 NOTE — Telephone Encounter (Signed)
Sonya Berry is asking for a new Percocet prescription.  Said she has had to take 2 tablets every 4 hours instead of one due to the pain being so bad. Her # [phone]  She uses [facility] if you should change it.

## 2013-01-10 ENCOUNTER — Other Ambulatory Visit: Payer: Self-pay | Admitting: *Deleted

## 2013-01-10 DIAGNOSIS — S93402A Sprain of unspecified ligament of left ankle, initial encounter: Secondary | ICD-10-CM

## 2013-01-10 MED ORDER — HYDROCODONE-ACETAMINOPHEN 10-325 MG PO TABS: 1.0000 | ORAL_TABLET | ORAL | Status: DC | PRN

## 2013-01-10 NOTE — Telephone Encounter (Signed)
Norco 10/325 one every 4 when necessary pain #42 with 3 refills

## 2013-01-10 NOTE — Telephone Encounter (Signed)
Faxed to pharmacy

## 2013-02-07 ENCOUNTER — Ambulatory Visit: Payer: Medicaid Other | Admitting: Orthopedic Surgery

## 2013-02-09 ENCOUNTER — Ambulatory Visit: Payer: Medicaid Other | Admitting: Orthopedic Surgery

## 2013-02-09 ENCOUNTER — Encounter: Payer: Self-pay | Admitting: Orthopedic Surgery

## 2013-03-02 ENCOUNTER — Ambulatory Visit (INDEPENDENT_AMBULATORY_CARE_PROVIDER_SITE_OTHER): Payer: Medicaid Other

## 2013-03-02 ENCOUNTER — Encounter: Payer: Self-pay | Admitting: Orthopedic Surgery

## 2013-03-02 ENCOUNTER — Ambulatory Visit (INDEPENDENT_AMBULATORY_CARE_PROVIDER_SITE_OTHER): Payer: Medicaid Other | Admitting: Orthopedic Surgery

## 2013-03-02 VITALS — BP 154/75 | Ht 62.0 in | Wt 202.0 lb

## 2013-03-02 DIAGNOSIS — S82899A Other fracture of unspecified lower leg, initial encounter for closed fracture: Secondary | ICD-10-CM | POA: Insufficient documentation

## 2013-03-02 DIAGNOSIS — IMO0001 Reserved for inherently not codable concepts without codable children: Secondary | ICD-10-CM

## 2013-03-02 DIAGNOSIS — S8290XD Unspecified fracture of unspecified lower leg, subsequent encounter for closed fracture with routine healing: Secondary | ICD-10-CM

## 2013-03-02 DIAGNOSIS — S82892D Other fracture of left lower leg, subsequent encounter for closed fracture with routine healing: Secondary | ICD-10-CM

## 2013-03-02 DIAGNOSIS — S92302D Fracture of unspecified metatarsal bone(s), left foot, subsequent encounter for fracture with routine healing: Secondary | ICD-10-CM

## 2013-03-02 NOTE — Patient Instructions (Signed)
Wear boot two more weeks

## 2013-03-02 NOTE — Progress Notes (Signed)
Patient ID: Sonya Berry, female   DOB: 25-Sep-1970, 42 y.o.   MRN: [id number]  Chief Complaint  Patient presents with  . Follow-up    Recheck Left ankle fracture d/t injury 12/29/12     Recheck left ankle avulsion injury and on looking at the initial films of the foot she also has a nondisplaced metatarsal fracture she's been in a boot for 6 weeks she still has some metatarsal pain she's concerned about her previous ankle reconstruction that looks good.  X-rays were done today ankle avulsion may actually be heterotopic bone from the reconstruction I think most of her pain and discomfort are in the metatarsal.  Review of systems she is not having any issues  The foot is tender over the metatarsal ankle stability shows a partial drawer test but it's a firm endpoint her motion is good strength is good her scans intact pulses good sensation is normal BP 154/75  Ht 5\' 2"  (1.575 m)  Wt 202 lb (91.627 kg)  BMI 36.94 kg/m2  LMP 02/15/2013  X-ray of the ankle shows avulsion but this may be old  Boot x2 more weeks which will treat both injuries,  followup as needed

## 2013-08-11 ENCOUNTER — Ambulatory Visit (HOSPITAL_COMMUNITY)
Admission: RE | Admit: 2013-08-11 | Discharge: 2013-08-11 | Disposition: A | Discharge status: Home / Self Care | Payer: Medicaid Other | Source: Ambulatory Visit | Attending: Family Medicine | Admitting: Family Medicine

## 2013-08-11 ENCOUNTER — Other Ambulatory Visit (HOSPITAL_COMMUNITY): Payer: Self-pay | Admitting: Family Medicine

## 2013-08-11 DIAGNOSIS — M545 Low back pain, unspecified: Secondary | ICD-10-CM

## 2013-08-11 DIAGNOSIS — M25559 Pain in unspecified hip: Secondary | ICD-10-CM

## 2013-08-16 ENCOUNTER — Other Ambulatory Visit (HOSPITAL_COMMUNITY): Payer: Self-pay | Admitting: Family Medicine

## 2013-08-16 DIAGNOSIS — M545 Low back pain, unspecified: Secondary | ICD-10-CM

## 2013-08-22 ENCOUNTER — Ambulatory Visit (HOSPITAL_COMMUNITY): Payer: Medicaid Other | Attending: Family Medicine

## 2013-09-01 ENCOUNTER — Ambulatory Visit (HOSPITAL_COMMUNITY): Payer: Medicaid Other

## 2013-12-09 ENCOUNTER — Emergency Department (HOSPITAL_COMMUNITY)
Admission: EM | Admit: 2013-12-09 | Discharge: 2013-12-09 | Disposition: A | Discharge status: Home / Self Care | Payer: Medicaid Other | Attending: Emergency Medicine | Admitting: Emergency Medicine

## 2013-12-09 ENCOUNTER — Encounter (HOSPITAL_COMMUNITY): Payer: Self-pay | Admitting: Emergency Medicine

## 2013-12-09 DIAGNOSIS — R062 Wheezing: Secondary | ICD-10-CM | POA: Insufficient documentation

## 2013-12-09 DIAGNOSIS — R21 Rash and other nonspecific skin eruption: Secondary | ICD-10-CM | POA: Insufficient documentation

## 2013-12-09 DIAGNOSIS — Z79899 Other long term (current) drug therapy: Secondary | ICD-10-CM | POA: Insufficient documentation

## 2013-12-09 DIAGNOSIS — R1310 Dysphagia, unspecified: Secondary | ICD-10-CM | POA: Insufficient documentation

## 2013-12-09 DIAGNOSIS — F17200 Nicotine dependence, unspecified, uncomplicated: Secondary | ICD-10-CM | POA: Insufficient documentation

## 2013-12-09 DIAGNOSIS — F411 Generalized anxiety disorder: Secondary | ICD-10-CM | POA: Insufficient documentation

## 2013-12-09 DIAGNOSIS — IMO0002 Reserved for concepts with insufficient information to code with codable children: Secondary | ICD-10-CM | POA: Insufficient documentation

## 2013-12-09 DIAGNOSIS — L299 Pruritus, unspecified: Secondary | ICD-10-CM | POA: Insufficient documentation

## 2013-12-09 DIAGNOSIS — T4995XA Adverse effect of unspecified topical agent, initial encounter: Secondary | ICD-10-CM | POA: Insufficient documentation

## 2013-12-09 DIAGNOSIS — G8929 Other chronic pain: Secondary | ICD-10-CM | POA: Insufficient documentation

## 2013-12-09 DIAGNOSIS — T7840XA Allergy, unspecified, initial encounter: Secondary | ICD-10-CM

## 2013-12-09 MED ORDER — EPINEPHRINE 0.3 MG/0.3ML IJ SOAJ: 0.3000 mg | Freq: Once | INTRAMUSCULAR | Status: DC

## 2013-12-09 MED ORDER — PREDNISONE 10 MG PO TABS: 20.0000 mg | ORAL_TABLET | Freq: Every day | ORAL | Status: DC

## 2013-12-09 MED ORDER — DIPHENHYDRAMINE HCL 25 MG PO CAPS: 50.0000 mg | ORAL_CAPSULE | Freq: Three times a day (TID) | ORAL | Status: DC | PRN

## 2013-12-09 MED ORDER — METHYLPREDNISOLONE SODIUM SUCC 125 MG IJ SOLR
125.0000 mg | Freq: Once | INTRAMUSCULAR | Status: AC
  Administered 2013-12-09: 125 mg via INTRAVENOUS
  Filled 2013-12-09: qty 2

## 2013-12-09 NOTE — Discharge Instructions (Signed)
Allergies  Allergies may happen from anything your body is sensitive to. This may be food, medicines, pollens, chemicals, and many other things. Food allergies can be severe and deadly.  HOME CARE  If you do not know what causes a reaction, keep a diary. Write down the foods you ate and the symptoms that followed. Avoid foods that cause reactions.  If you have red raised spots (hives) or a rash:  Take medicine as told by your doctor.  Use medicines for red raised spots and itching as needed.  Apply cold cloths (compresses) to the skin. Take a cool bath. Avoid hot baths or showers.  If you are severely allergic:  It is often necessary to go to the hospital after you have treated your reaction.  Wear your medical alert jewelry.  You and your family must learn how to give a allergy shot or use an allergy kit (anaphylaxis kit).  Always carry your allergy kit or shot with you. Use this medicine as told by your doctor if a severe reaction is occurring. GET HELP RIGHT AWAY IF:  You have trouble breathing or are making high-pitched whistling sounds (wheezing).  You have a tight feeling in your chest or throat.  You have a puffy (swollen) mouth.  You have red raised spots, puffiness (swelling), or itching all over your body.  You have had a severe reaction that was helped by your allergy kit or shot. The reaction can return once the medicine has worn off.  You think you are having a food allergy. Symptoms most often happen within 30 minutes of eating a food.  Your symptoms have not gone away within 2 days or are getting worse.  You have new symptoms.  You want to retest yourself with a food or drink you think causes an allergic reaction. Only do this under the care of a doctor. MAKE SURE YOU:   Understand these instructions.  Will watch your condition.  Will get help right away if you are not doing well or get worse. Document Released: 10/24/2012 Document Reviewed:  10/24/2012 [facility] Patient Information 2014 [place], [place]. It is important that your take the Prednisone for the next 5 days  If you again develop shortness of breath, intense itching, or difficulty swallowing  return for further treatment

## 2013-12-09 NOTE — ED Notes (Signed)
Pt reports waking up with itching, pt states she had hives and was having difficulty breathing. Upon arrival EMS states pt was wheezing and hypotensive. Given 50 mg benadryl, 50 mg Zantac, 1:1 0.3 IM EPI, 10 mg albuterol neb. Alert and oriented.

## 2013-12-09 NOTE — ED Notes (Signed)
Bed: NB56 Expected date:  Expected time:  Means of arrival:  Comments: EMS/allergic reaction

## 2013-12-09 NOTE — ED Provider Notes (Signed)
Medical screening examination/treatment/procedure(s) were performed by non-physician practitioner and as supervising physician I was immediately available for consultation/collaboration.   EKG Interpretation None        Julianne Rice, MD 12/09/13 [phone]

## 2013-12-09 NOTE — ED Provider Notes (Signed)
CSN: [id number]     Arrival date & time 12/09/13  0335 History   First MD Initiated Contact with Patient 12/09/13 0345     Chief Complaint  Patient presents with  . Allergic Reaction     (Consider location/radiation/quality/duration/timing/severity/associated sxs/prior Treatment) HPI Comments: PAteint has been ill with GI/viral illness yesterday was asleep when she woke with SOB, generalizer itching and hives  EMS called and given Benadryl, Zantac and albuterol inhaler   With decrease in symptoms   No previous Hx of same   Patient is a 43 y.o. female presenting with allergic reaction. The history is provided by the patient.  Allergic Reaction Presenting symptoms: difficulty breathing, difficulty swallowing, itching, rash and wheezing   Difficulty breathing:    Severity:  Moderate   Onset quality:  Sudden   Timing:  Constant Difficulty swallowing:    Severity:  Mild   Onset quality:  Sudden Itching:    Location:  Full body   Severity:  Mild   Onset quality:  Sudden   Timing:  Constant   Progression:  Improving Severity:  Moderate Relieved by:  Antihistamines and bronchodilators Worsened by:  Nothing tried   Past Medical History  Diagnosis Date  . Anxiety   . Chronic back pain    Past Surgical History  Procedure Laterality Date  . Bilateral ankle surgery     History reviewed. No pertinent family history. History  Substance Use Topics  . Smoking status: Current Every Day Smoker -- 1.00 packs/day  . Smokeless tobacco: Not on file  . Alcohol Use: No   OB History   Grav Para Term Preterm Abortions TAB SAB Ect Mult Living                 Review of Systems  Constitutional: Negative for fever.  HENT: Positive for trouble swallowing.   Respiratory: Positive for shortness of breath and wheezing.   Cardiovascular: Negative for chest pain.  Gastrointestinal: Negative for nausea and abdominal pain.  Musculoskeletal: Negative for neck pain.  Skin: Positive for itching  and rash.  All other systems reviewed and are negative.     Allergies  Gelatin; Celebrex; Diclofenac sodium; Escitalopram oxalate; Levaquin; Rofecoxib; Tramadol hcl; and Ultram  Home Medications   Prior to Admission medications   Medication Sig Start Date End Date Taking? Authorizing Provider  albuterol (PROVENTIL HFA;VENTOLIN HFA) 108 (90 BASE) MCG/ACT inhaler Inhale 1-2 puffs into the lungs every 6 (six) hours as needed for wheezing or shortness of breath.   Yes Historical Provider, MD  ALPRAZolam Duanne Moron) 1 MG tablet Take 1 mg by mouth 4 (four) times daily.   Yes Historical Provider, MD  omeprazole (PRILOSEC) 20 MG capsule Take 20 mg by mouth daily.   Yes Historical Provider, MD  oxyCODONE-acetaminophen (PERCOCET/ROXICET) 5-325 MG per tablet Take 1 tablet by mouth every 4 (four) hours as needed for pain. 01/02/13  Yes Carole Civil, MD  pantoprazole (PROTONIX) 40 MG tablet Take 40 mg by mouth daily.   Yes Historical Provider, MD  permethrin (ELIMITE) 5 % cream Apply 1 application topically once.   Yes Historical Provider, MD  triamcinolone cream (KENALOG) 0.5 % Apply 1 application topically 2 (two) times daily.   Yes Historical Provider, MD  diphenhydrAMINE (BENADRYL) 25 mg capsule Take 2 capsules (50 mg total) by mouth every 8 (eight) hours as needed for allergies. 12/09/13   Garald Balding, NP  predniSONE (DELTASONE) 10 MG tablet Take 2 tablets (20 mg total) by mouth daily.  12/09/13   Garald Balding, NP   BP 113/69  Pulse 117  Temp(Src) 98.2 F (36.8 C) (Oral)  Resp 20  SpO2 96% Physical Exam  Vitals reviewed. Constitutional: She is oriented to person, place, and time. She appears well-developed and well-nourished.  HENT:  Head: Normocephalic.  Eyes: Pupils are equal, round, and reactive to light.  Neck: Normal range of motion.  Cardiovascular: Normal rate and regular rhythm.   Pulmonary/Chest: Effort normal and breath sounds normal. No respiratory distress. She has no  wheezes.  Abdominal: Soft.  Musculoskeletal: Normal range of motion.  Neurological: She is alert and oriented to person, place, and time.  Skin: Skin is warm. No rash noted. No erythema.  Examined after IV meds by EMS    ED Course  Procedures (including critical care time) Labs Review Labs Reviewed - No data to display  Imaging Review No results found.   EKG Interpretation None      MDM  patinet is improved   Instructed on treatment plan given return parameters will FU PCP  Final diagnoses:  Allergic reaction         Garald Balding, NP 12/09/13 [phone]

## 2014-07-01 ENCOUNTER — Encounter (HOSPITAL_COMMUNITY): Payer: Self-pay | Admitting: *Deleted

## 2014-07-01 ENCOUNTER — Inpatient Hospital Stay (HOSPITAL_COMMUNITY)
Admission: AD | Admit: 2014-07-01 | Discharge: 2014-07-02 | Disposition: A | Discharge status: Home / Self Care | Payer: Medicaid Other | Source: Ambulatory Visit | Attending: Family Medicine | Admitting: Family Medicine

## 2014-07-01 DIAGNOSIS — N939 Abnormal uterine and vaginal bleeding, unspecified: Secondary | ICD-10-CM | POA: Diagnosis present

## 2014-07-01 DIAGNOSIS — Z720 Tobacco use: Secondary | ICD-10-CM | POA: Diagnosis not present

## 2014-07-01 DIAGNOSIS — N83201 Unspecified ovarian cyst, right side: Secondary | ICD-10-CM

## 2014-07-01 DIAGNOSIS — N8320 Unspecified ovarian cysts: Secondary | ICD-10-CM | POA: Diagnosis not present

## 2014-07-01 DIAGNOSIS — R1032 Left lower quadrant pain: Secondary | ICD-10-CM | POA: Diagnosis present

## 2014-07-01 DIAGNOSIS — R102 Pelvic and perineal pain: Secondary | ICD-10-CM

## 2014-07-01 HISTORY — DX: Benign neoplasm of connective and other soft tissue, unspecified: D21.9

## 2014-07-01 HISTORY — DX: Unspecified asthma, uncomplicated: J45.909

## 2014-07-01 HISTORY — DX: Unspecified abnormal cytological findings in specimens from vagina: R87.629

## 2014-07-01 HISTORY — DX: Gastro-esophageal reflux disease without esophagitis: K21.9

## 2014-07-01 HISTORY — DX: Anemia, unspecified: D64.9

## 2014-07-01 LAB — URINALYSIS, ROUTINE W REFLEX MICROSCOPIC
Bilirubin Urine: NEGATIVE
Glucose, UA: NEGATIVE mg/dL
KETONES UR: NEGATIVE mg/dL
LEUKOCYTES UA: NEGATIVE
Nitrite: NEGATIVE
PROTEIN: NEGATIVE mg/dL
Specific Gravity, Urine: 1.01 (ref 1.005–1.030)
Urobilinogen, UA: 0.2 mg/dL (ref 0.0–1.0)
pH: 6.5 (ref 5.0–8.0)

## 2014-07-01 LAB — POCT PREGNANCY, URINE: Preg Test, Ur: NEGATIVE

## 2014-07-01 LAB — URINE MICROSCOPIC-ADD ON

## 2014-07-01 NOTE — MAU Note (Signed)
LMP was the end of November, started bleeding again 8 days ago. LLQ pain since bleeding started. States has hx of fibroid tumor and was supposed to have hysterectomy.

## 2014-07-02 ENCOUNTER — Inpatient Hospital Stay (HOSPITAL_COMMUNITY): Payer: Medicaid Other

## 2014-07-02 DIAGNOSIS — N8320 Unspecified ovarian cysts: Secondary | ICD-10-CM

## 2014-07-02 DIAGNOSIS — R102 Pelvic and perineal pain: Secondary | ICD-10-CM

## 2014-07-02 LAB — CBC
HEMATOCRIT: 29.2 % — AB (ref 36.0–46.0)
HEMOGLOBIN: 8.2 g/dL — AB (ref 12.0–15.0)
MCH: 18.6 pg — ABNORMAL LOW (ref 26.0–34.0)
MCHC: 28.1 g/dL — ABNORMAL LOW (ref 30.0–36.0)
MCV: 66.2 fL — ABNORMAL LOW (ref 78.0–100.0)
Platelets: 475 10*3/uL — ABNORMAL HIGH (ref 150–400)
RBC: 4.41 MIL/uL (ref 3.87–5.11)
RDW: 19.2 % — ABNORMAL HIGH (ref 11.5–15.5)
WBC: 10.1 10*3/uL (ref 4.0–10.5)

## 2014-07-02 LAB — HIV ANTIBODY (ROUTINE TESTING W REFLEX): HIV 1&2 Ab, 4th Generation: NONREACTIVE

## 2014-07-02 LAB — WET PREP, GENITAL
Clue Cells Wet Prep HPF POC: NONE SEEN
Trich, Wet Prep: NONE SEEN
YEAST WET PREP: NONE SEEN

## 2014-07-02 NOTE — MAU Provider Note (Signed)
History     CSN: [id number]  Arrival date and time: 07/01/14 2214   First Provider Initiated Contact with Patient 07/02/14 0025      Chief Complaint  Patient presents with  . Abdominal Pain  . Vaginal Bleeding   HPI  Sonya Berry is a 43 y.o. [id number] who presents today with vaginal bleeding, and LLQ pain. She states that she had a BTL in 2012, and has not been seen anywhere since that time. She states that she had a normal period on 06/05/14 and then she states that she started bleeding on 06/25/14, and has been bleeding since. She has also started having LLQ pain, and has been taking percocet for the the pain. She states that she last took a percocet around 1700. She states that it has been helping with the pain.   Past Medical History  Diagnosis Date  . Anxiety   . Chronic back pain   . Anemia   . Asthma   . GERD (gastroesophageal reflux disease)   . Fibroid   . Vaginal Pap smear, abnormal     not had pap smear since 2012    Past Surgical History  Procedure Laterality Date  . Bilateral ankle surgery    . Cesarean section    . Myomectomy abdominal approach    . Multiple tooth extractions      History reviewed. No pertinent family history.  History  Substance Use Topics  . Smoking status: Current Every Day Smoker -- 1.00 packs/day  . Smokeless tobacco: Not on file  . Alcohol Use: Yes    Allergies:  Allergies  Allergen Reactions  . Gelatin Anaphylaxis  . Celebrex [Celecoxib]     Joints freeze up  . Diclofenac Sodium     REACTION: jittery, chest pains; anxiety; unable to void  . Escitalopram Oxalate     REACTION: jittery; anuria; hyperirritability  . Levaquin [Levofloxacin In D5w] Hives  . Rofecoxib     REACTION: Unknown reaction  . Tramadol Hcl     REACTION: Unknown reaction  . Ultram [Tramadol]     unknown    Prescriptions prior to admission  Medication Sig Dispense Refill Last Dose  . albuterol (PROVENTIL HFA;VENTOLIN HFA) 108 (90 BASE) MCG/ACT  inhaler Inhale 1-2 puffs into the lungs every 6 (six) hours as needed for wheezing or shortness of breath.   Past Week at Unknown time  . ALPRAZolam (XANAX) 1 MG tablet Take 1 mg by mouth 4 (four) times daily.   06/30/2014 at Unknown time  . diphenhydrAMINE (BENADRYL) 25 mg capsule Take 2 capsules (50 mg total) by mouth every 8 (eight) hours as needed for allergies. 30 capsule 0 Past Week at Unknown time  . oxyCODONE-acetaminophen (PERCOCET/ROXICET) 5-325 MG per tablet Take 1 tablet by mouth every 4 (four) hours as needed for pain. 84 tablet 0 07/01/2014 at 1700  . EPINEPHrine (EPIPEN) 0.3 mg/0.3 mL IJ SOAJ injection Inject 0.3 mLs (0.3 mg total) into the muscle once. 1 Device 0 More than a month at Unknown time  . omeprazole (PRILOSEC) 20 MG capsule Take 20 mg by mouth daily.   More than a month at Unknown time  . pantoprazole (PROTONIX) 40 MG tablet Take 40 mg by mouth daily.   More than a month at Unknown time  . permethrin (ELIMITE) 5 % cream Apply 1 application topically once.   More than a month at Unknown time  . predniSONE (DELTASONE) 10 MG tablet Take 2 tablets (20 mg total) by mouth  daily. 10 tablet 0 More than a month at Unknown time  . triamcinolone cream (KENALOG) 0.5 % Apply 1 application topically 2 (two) times daily.   More than a month at Unknown time    ROS Physical Exam   Blood pressure 144/88, pulse 78, temperature 98.1 F (36.7 C), temperature source Oral, resp. rate 16, height 5\' 5"  (1.651 m), weight 97.07 kg (214 lb), last menstrual period 06/23/2014, SpO2 97 %.  Physical Exam  Nursing note and vitals reviewed. Constitutional: She is oriented to person, place, and time. She appears well-developed and well-nourished.  Cardiovascular: Normal rate.   Respiratory: Effort normal.  GI: Soft. There is no tenderness. There is no rebound.  Genitourinary:   External: no lesion Vagina: scant amount of pink blood in the vagina.  Cervix: pink, smooth, no CMT Uterus:  NSSC Adnexa: NT   Neurological: She is alert and oriented to person, place, and time.  Skin: Skin is warm and dry.  Psychiatric: She has a normal mood and affect.    MAU Course  Procedures 0030: Offered patient additional pain medication. She declines at this time.  Results for orders placed or performed during the hospital encounter of 07/01/14 (from the past 24 hour(s))  Urinalysis, Routine w reflex microscopic     Status: Abnormal   Collection Time: 07/01/14 10:34 PM  Result Value Ref Range   Color, Urine YELLOW YELLOW   APPearance CLEAR CLEAR   Specific Gravity, Urine 1.010 1.005 - 1.030   pH 6.5 5.0 - 8.0   Glucose, UA NEGATIVE NEGATIVE mg/dL   Hgb urine dipstick LARGE (A) NEGATIVE   Bilirubin Urine NEGATIVE NEGATIVE   Ketones, ur NEGATIVE NEGATIVE mg/dL   Protein, ur NEGATIVE NEGATIVE mg/dL   Urobilinogen, UA 0.2 0.0 - 1.0 mg/dL   Nitrite NEGATIVE NEGATIVE   Leukocytes, UA NEGATIVE NEGATIVE  Urine microscopic-add on     Status: None   Collection Time: 07/01/14 10:34 PM  Result Value Ref Range   Squamous Epithelial / LPF RARE RARE   WBC, UA 0-2 <3 WBC/hpf   RBC / HPF 7-10 <3 RBC/hpf   Bacteria, UA RARE RARE  Pregnancy, urine POC     Status: None   Collection Time: 07/01/14 10:44 PM  Result Value Ref Range   Preg Test, Ur NEGATIVE NEGATIVE  CBC     Status: Abnormal   Collection Time: 07/02/14 12:30 AM  Result Value Ref Range   WBC 10.1 4.0 - 10.5 K/uL   RBC 4.41 3.87 - 5.11 MIL/uL   Hemoglobin 8.2 (L) 12.0 - 15.0 g/dL   HCT 29.2 (L) 36.0 - 46.0 %   MCV 66.2 (L) 78.0 - 100.0 fL   MCH 18.6 (L) 26.0 - 34.0 pg   MCHC 28.1 (L) 30.0 - 36.0 g/dL   RDW 19.2 (H) 11.5 - 15.5 %   Platelets 475 (H) 150 - 400 K/uL  Wet prep, genital     Status: Abnormal   Collection Time: 07/02/14 12:36 AM  Result Value Ref Range   Yeast Wet Prep HPF POC NONE SEEN NONE SEEN   Trich, Wet Prep NONE SEEN NONE SEEN   Clue Cells Wet Prep HPF POC NONE SEEN NONE SEEN   WBC, Wet Prep HPF POC  MODERATE (A) NONE SEEN   US Transvaginal Non-ob  07/02/2014   CLINICAL DATA:  Dysfunctional uterine bleeding, prior myomectomy  EXAM: TRANSABDOMINAL AND TRANSVAGINAL ULTRASOUND OF PELVIS  TECHNIQUE: Both transabdominal and transvaginal ultrasound examinations of the pelvis were performed.  Transabdominal technique was performed for global imaging of the pelvis including uterus, ovaries, adnexal regions, and pelvic cul-de-sac. It was necessary to proceed with endovaginal exam following the transabdominal exam to visualize the endometrium and ovaries.  COMPARISON:  10/14/2010 pelvic ultrasound  FINDINGS: Uterus  Measurements: 9.1 x 6.6 x 5.1 cm, anteverted, anteflexed. No fibroids or other mass visualized.  Endometrium  Thickness: 5 mm, minimal inhomogeneity at the level of the C-section scar but no significant change allowing for differences in technique. No focal abnormality visualized.  Right ovary  Measurements: 6.0 x 5.0 x 5.0 cm. Simple appearing cyst measures 5.1 x 4.4 x 4.1 cm.  Left ovary  Measurements: 4.0 x 2.2 x 1.7 cm. Normal appearance/no adnexal mass.  Other findings  No free fluid.  IMPRESSION: Allowing for mild inhomogeneity of the endometrial stripe at the level of the C-section scar, no focal abnormality or acute finding.  Simple appearing right ovarian cyst. This is almost certainly benign, and no specific imaging follow up is recommended according to the Society of Radiologists in [place] (D Clovis Riley et al. Management of Asymptomatic Ovarian and Other Adnexal Cysts Imaged at [place]: Society of Radiologists in [place] Statement 2010. Radiology 256 (Sept 2010): 102-725.).   Electronically Signed   By: Conchita Paris M.D.   On: 07/02/2014 01:51   US Pelvis Complete  07/02/2014   CLINICAL DATA:  Dysfunctional uterine bleeding, prior myomectomy  EXAM: TRANSABDOMINAL AND TRANSVAGINAL ULTRASOUND OF PELVIS  TECHNIQUE: Both transabdominal and  transvaginal ultrasound examinations of the pelvis were performed. Transabdominal technique was performed for global imaging of the pelvis including uterus, ovaries, adnexal regions, and pelvic cul-de-sac. It was necessary to proceed with endovaginal exam following the transabdominal exam to visualize the endometrium and ovaries.  COMPARISON:  10/14/2010 pelvic ultrasound  FINDINGS: Uterus  Measurements: 9.1 x 6.6 x 5.1 cm, anteverted, anteflexed. No fibroids or other mass visualized.  Endometrium  Thickness: 5 mm, minimal inhomogeneity at the level of the C-section scar but no significant change allowing for differences in technique. No focal abnormality visualized.  Right ovary  Measurements: 6.0 x 5.0 x 5.0 cm. Simple appearing cyst measures 5.1 x 4.4 x 4.1 cm.  Left ovary  Measurements: 4.0 x 2.2 x 1.7 cm. Normal appearance/no adnexal mass.  Other findings  No free fluid.  IMPRESSION: Allowing for mild inhomogeneity of the endometrial stripe at the level of the C-section scar, no focal abnormality or acute finding.  Simple appearing right ovarian cyst. This is almost certainly benign, and no specific imaging follow up is recommended according to the Society of Radiologists in [place] (D Clovis Riley et al. Management of Asymptomatic Ovarian and Other Adnexal Cysts Imaged at [place]: Society of Radiologists in [place] Statement 2010. Radiology 256 (Sept 2010): 366-440.).   Electronically Signed   By: Conchita Paris M.D.   On: 07/02/2014 01:51     Assessment and Plan   1. Cyst of right ovary   2. Pelvic pain in female    Comfort measures Return to MAU as needed FU with your previous OBGYN as needed  Follow-up Information    Follow up with [facility] Marjean Donna, MD.   Specialty:  Obstetrics and Gynecology   Contact information:   [place] [id number] [phone]        Mathis Bud 07/02/2014, 12:25  AM

## 2014-07-02 NOTE — Discharge Instructions (Signed)
Ovarian Cyst An ovarian cyst is a fluid-filled sac that forms on an ovary. The ovaries are small organs that produce eggs in women. Various types of cysts can form on the ovaries. Most are not cancerous. Many do not cause problems, and they often go away on their own. Some may cause symptoms and require treatment. Common types of ovarian cysts include:  Functional cysts--These cysts may occur every month during the menstrual cycle. This is normal. The cysts usually go away with the next menstrual cycle if the woman does not get pregnant. Usually, there are no symptoms with a functional cyst.  CAUSES   Fertility drugs can cause a condition in which multiple large cysts are formed on the ovaries. This is called ovarian hyperstimulation syndrome.  A condition called polycystic ovary syndrome can cause hormonal imbalances that can lead to nonfunctional ovarian cysts. SIGNS AND SYMPTOMS  Many ovarian cysts do not cause symptoms. If symptoms are present, they may include:  Pelvic pain or pressure.  Pain in the lower abdomen.  Pain during sexual intercourse.  Increasing girth (swelling) of the abdomen.  Abnormal menstrual periods.  Increasing pain with menstrual periods.  Stopping having menstrual periods without being pregnant. DIAGNOSIS  These cysts are commonly found during a routine or annual pelvic exam. Tests may be ordered to find out more about the cyst. These tests may include:  Ultrasound.  X-ray of the pelvis.  CT scan.  MRI.  Blood tests. TREATMENT  Many ovarian cysts go away on their own without treatment. Your health care provider may want to check your cyst regularly for 2-3 months to see if it changes. For women in menopause, it is particularly important to monitor a cyst closely because of the higher rate of ovarian cancer in menopausal women. When treatment is needed, it may include any of the following:  A procedure to drain the cyst (aspiration). This may be done  using a long needle and ultrasound. It can also be done through a laparoscopic procedure. This involves using a thin, lighted tube with a tiny camera on the end (laparoscope) inserted through a small incision.  Surgery to remove the whole cyst. This may be done using laparoscopic surgery or an open surgery involving a larger incision in the lower abdomen.  Hormone treatment or birth control pills. These methods are sometimes used to help dissolve a cyst. HOME CARE INSTRUCTIONS   Only take over-the-counter or prescription medicines as directed by your health care provider.  Follow up with your health care provider as directed.  Get regular pelvic exams and Pap tests. SEEK MEDICAL CARE IF:   Your periods are late, irregular, or painful, or they stop.  Your pelvic pain or abdominal pain does not go away.  Your abdomen becomes larger or swollen.  You have pressure on your bladder or trouble emptying your bladder completely.  You have pain during sexual intercourse.  You have feelings of fullness, pressure, or discomfort in your stomach.  You lose weight for no apparent reason.  You feel generally ill.  You become constipated.  You lose your appetite.  You develop acne.  You have an increase in body and facial hair.  You are gaining weight, without changing your exercise and eating habits.  You think you are pregnant. SEEK IMMEDIATE MEDICAL CARE IF:   You have increasing abdominal pain.  You feel sick to your stomach (nauseous), and you throw up (vomit).  You develop a fever that comes on suddenly.  You  have abdominal pain during a bowel movement.  Your menstrual periods become heavier than usual. MAKE SURE YOU:  Understand these instructions.  Will watch your condition.  Will get help right away if you are not doing well or get worse. Document Released: 06/29/2005 Document Revised: 07/04/2013 Document Reviewed: 03/06/2013 [facility] Patient Information 2015  [place], [place]. This information is not intended to replace advice given to you by your health care provider. Make sure you discuss any questions you have with your health care provider.

## 2014-07-03 LAB — GC/CHLAMYDIA PROBE AMP
CT PROBE, AMP APTIMA: NEGATIVE
GC Probe RNA: NEGATIVE

## 2014-08-12 ENCOUNTER — Emergency Department (HOSPITAL_COMMUNITY)
Admission: EM | Admit: 2014-08-12 | Discharge: 2014-08-12 | Disposition: A | Discharge status: Home / Self Care | Payer: Medicaid Other | Attending: Emergency Medicine | Admitting: Emergency Medicine

## 2014-08-12 ENCOUNTER — Encounter (HOSPITAL_COMMUNITY): Payer: Self-pay | Admitting: Emergency Medicine

## 2014-08-12 DIAGNOSIS — Y9389 Activity, other specified: Secondary | ICD-10-CM | POA: Insufficient documentation

## 2014-08-12 DIAGNOSIS — G8929 Other chronic pain: Secondary | ICD-10-CM | POA: Diagnosis not present

## 2014-08-12 DIAGNOSIS — Z720 Tobacco use: Secondary | ICD-10-CM | POA: Insufficient documentation

## 2014-08-12 DIAGNOSIS — T7840XA Allergy, unspecified, initial encounter: Secondary | ICD-10-CM

## 2014-08-12 DIAGNOSIS — R21 Rash and other nonspecific skin eruption: Secondary | ICD-10-CM | POA: Diagnosis present

## 2014-08-12 DIAGNOSIS — Z8742 Personal history of other diseases of the female genital tract: Secondary | ICD-10-CM | POA: Diagnosis not present

## 2014-08-12 DIAGNOSIS — J45901 Unspecified asthma with (acute) exacerbation: Secondary | ICD-10-CM | POA: Insufficient documentation

## 2014-08-12 DIAGNOSIS — Y998 Other external cause status: Secondary | ICD-10-CM | POA: Insufficient documentation

## 2014-08-12 DIAGNOSIS — Z862 Personal history of diseases of the blood and blood-forming organs and certain disorders involving the immune mechanism: Secondary | ICD-10-CM | POA: Insufficient documentation

## 2014-08-12 DIAGNOSIS — K219 Gastro-esophageal reflux disease without esophagitis: Secondary | ICD-10-CM | POA: Diagnosis not present

## 2014-08-12 DIAGNOSIS — T471X5A Adverse effect of other antacids and anti-gastric-secretion drugs, initial encounter: Secondary | ICD-10-CM | POA: Diagnosis not present

## 2014-08-12 DIAGNOSIS — Y9289 Other specified places as the place of occurrence of the external cause: Secondary | ICD-10-CM | POA: Diagnosis not present

## 2014-08-12 DIAGNOSIS — F419 Anxiety disorder, unspecified: Secondary | ICD-10-CM | POA: Insufficient documentation

## 2014-08-12 DIAGNOSIS — Z79899 Other long term (current) drug therapy: Secondary | ICD-10-CM | POA: Insufficient documentation

## 2014-08-12 MED ORDER — PREDNISONE 10 MG PO TABS: 20.0000 mg | ORAL_TABLET | Freq: Every day | ORAL | Status: DC

## 2014-08-12 MED ORDER — METHYLPREDNISOLONE SODIUM SUCC 125 MG IJ SOLR
125.0000 mg | Freq: Once | INTRAMUSCULAR | Status: AC
  Administered 2014-08-12: 125 mg via INTRAVENOUS
  Filled 2014-08-12: qty 2

## 2014-08-12 MED ORDER — EPINEPHRINE 0.3 MG/0.3ML IJ SOAJ: 0.3000 mg | Freq: Once | INTRAMUSCULAR | Status: DC

## 2014-08-12 NOTE — ED Provider Notes (Addendum)
CSN: [id number]     Arrival date & time 08/12/14  7412 History   First MD Initiated Contact with Patient 08/12/14 [phone]     Chief Complaint  Patient presents with  . Allergic Reaction     (Consider location/radiation/quality/duration/timing/severity/associated sxs/prior Treatment) Patient is a 44 y.o. female presenting with allergic reaction. The history is provided by the patient (pt states she took protonix and developed a rash.  she has had an allergic reaction to protonix before).  Allergic Reaction Presenting symptoms: difficulty breathing, rash and wheezing   Difficulty breathing:    Severity:  Mild   Onset quality:  Sudden   Timing:  Intermittent Severity:  Mild Context: no animal exposure   Relieved by:  Antihistamines Worsened by:  Nothing tried Ineffective treatments:  None tried   Past Medical History  Diagnosis Date  . Anxiety   . Chronic back pain   . Anemia   . Asthma   . GERD (gastroesophageal reflux disease)   . Fibroid   . Vaginal Pap smear, abnormal     not had pap smear since 2012   Past Surgical History  Procedure Laterality Date  . Bilateral ankle surgery    . Cesarean section    . Myomectomy abdominal approach    . Multiple tooth extractions     No family history on file. History  Substance Use Topics  . Smoking status: Current Every Day Smoker -- 1.00 packs/day  . Smokeless tobacco: Not on file  . Alcohol Use: Yes   OB History    Gravida Para Term Preterm AB TAB SAB Ectopic Multiple Living   5 3   2  2   3      Review of Systems  Constitutional: Negative for appetite change and fatigue.  HENT: Negative for congestion, ear discharge and sinus pressure.   Eyes: Negative for discharge.  Respiratory: Positive for wheezing. Negative for cough.   Cardiovascular: Negative for chest pain.  Gastrointestinal: Negative for abdominal pain and diarrhea.  Endocrine: Negative for heat intolerance.  Genitourinary: Negative for frequency and  hematuria.  Musculoskeletal: Negative for back pain.  Skin: Positive for rash.  Allergic/Immunologic: Negative for immunocompromised state.  Neurological: Negative for seizures and headaches.  Psychiatric/Behavioral: Negative for suicidal ideas and hallucinations.      Allergies  Gelatin; Pantoprazole; Celebrex; Diclofenac sodium; Escitalopram oxalate; Levaquin; Rofecoxib; Tramadol hcl; and Ultram  Home Medications   Prior to Admission medications   Medication Sig Start Date End Date Taking? Authorizing Provider  albuterol (PROVENTIL HFA;VENTOLIN HFA) 108 (90 BASE) MCG/ACT inhaler Inhale 1-2 puffs into the lungs every 6 (six) hours as needed for wheezing or shortness of breath.   Yes Historical Provider, MD  diphenhydrAMINE (BENADRYL) 25 mg capsule Take 2 capsules (50 mg total) by mouth every 8 (eight) hours as needed for allergies. 12/09/13  Yes Garald Balding, NP  EPINEPHrine (EPIPEN) 0.3 mg/0.3 mL IJ SOAJ injection Inject 0.3 mLs (0.3 mg total) into the muscle once. 12/09/13  Yes Garald Balding, NP  omeprazole (PRILOSEC) 20 MG capsule Take 20 mg by mouth daily.   Yes Historical Provider, MD  oxyCODONE-acetaminophen (PERCOCET) 10-325 MG per tablet Take 1 tablet by mouth every 4 (four) hours as needed for pain.   Yes Historical Provider, MD  oxyCODONE-acetaminophen (PERCOCET/ROXICET) 5-325 MG per tablet Take 1 tablet by mouth every 4 (four) hours as needed for pain. 01/02/13  Yes Carole Civil, MD  ALPRAZolam Duanne Moron) 1 MG tablet Take 1 mg by mouth  4 (four) times daily.    Historical Provider, MD  pantoprazole (PROTONIX) 40 MG tablet Take 40 mg by mouth daily.    Historical Provider, MD  permethrin (ELIMITE) 5 % cream Apply 1 application topically once.    Historical Provider, MD  predniSONE (DELTASONE) 10 MG tablet Take 2 tablets (20 mg total) by mouth daily. Patient not taking: Reported on 08/12/2014 12/09/13   Garald Balding, NP  triamcinolone cream (KENALOG) 0.5 % Apply 1 application  topically 2 (two) times daily.    Historical Provider, MD   BP 131/72 mmHg  Pulse 98  Temp(Src) 98.1 F (36.7 C) (Oral)  Resp 20  SpO2 98%  LMP 07/29/2014 Physical Exam  Constitutional: She is oriented to person, place, and time. She appears well-developed.  HENT:  Head: Normocephalic.  Eyes: Conjunctivae and EOM are normal. No scleral icterus.  Neck: Neck supple. No thyromegaly present.  Cardiovascular: Normal rate and regular rhythm.  Exam reveals no gallop and no friction rub.   No murmur heard. Pulmonary/Chest: No stridor. She has no wheezes. She has no rales. She exhibits no tenderness.  Abdominal: She exhibits no distension. There is no tenderness. There is no rebound.  Musculoskeletal: Normal range of motion. She exhibits no edema.  Lymphadenopathy:    She has no cervical adenopathy.  Neurological: She is oriented to person, place, and time. She exhibits normal muscle tone. Coordination normal.  Skin: No rash noted. No erythema.  Psychiatric: She has a normal mood and affect. Her behavior is normal.    ED Course  Procedures (including critical care time) Labs Review Labs Reviewed - No data to display  Imaging Review No results found.   EKG Interpretation None      MDM   Final diagnoses:  Allergic reaction, initial encounter    Allergic reaction.  Pt improved with tx.   Pt given prednisone and epi pen and will follow up as needed    Maudry Diego, MD 08/12/14 [place], MD 08/12/14 1026

## 2014-08-12 NOTE — ED Notes (Signed)
Pt sts that she took Protonix in the summer and had a similar reaction. Took same medication last pm and had similar reaction. Pt is A&O and in NAD

## 2014-08-12 NOTE — ED Notes (Signed)
Pt airway intact as she and her family went outside to smoke and have returned to room.

## 2014-08-12 NOTE — ED Notes (Signed)
Bed: WA04 Expected date: 08/12/14 Expected time: 8:48 AM Means of arrival: Ambulance Comments: Allergic reaction, treated by EMS

## 2014-08-12 NOTE — Discharge Instructions (Signed)
Take benadryl for rash or itching.   Follow up next week if any problems

## 2014-08-12 NOTE — ED Notes (Addendum)
Pt from home via EMS- Per EMS pt has hx of allergic reaction but did not know origin. Pt took Protonix last pm. Pt experienced itching, hives, wheezing upon waking. Pt used epi pen to R thigh PTA of EMS. Pt received 50 benadryl, 50 Zantac and 5 mg albuterol neb. Pt symptoms resolved with meds. Pt is A&O and in NAD

## 2014-08-23 ENCOUNTER — Ambulatory Visit: Payer: Medicaid Other | Admitting: Obstetrics & Gynecology

## 2014-09-13 ENCOUNTER — Ambulatory Visit: Payer: Medicaid Other | Admitting: Family Medicine

## 2014-12-31 ENCOUNTER — Encounter: Payer: Self-pay | Admitting: Obstetrics and Gynecology

## 2015-01-22 ENCOUNTER — Telehealth: Payer: Self-pay | Admitting: Gastroenterology

## 2015-01-22 ENCOUNTER — Encounter: Payer: Self-pay | Admitting: Gastroenterology

## 2015-01-22 ENCOUNTER — Ambulatory Visit: Payer: Medicaid Other | Admitting: Gastroenterology

## 2015-01-22 NOTE — Telephone Encounter (Signed)
Pt was a no show

## 2015-01-22 NOTE — Telephone Encounter (Signed)
Mailed letter to patient and PCP is aware

## 2015-02-27 ENCOUNTER — Encounter (HOSPITAL_COMMUNITY): Payer: Self-pay | Admitting: Emergency Medicine

## 2015-02-27 ENCOUNTER — Emergency Department (HOSPITAL_COMMUNITY)
Admission: EM | Admit: 2015-02-27 | Discharge: 2015-02-27 | Disposition: A | Discharge status: Home / Self Care | Payer: Medicaid Other | Attending: Physician Assistant | Admitting: Physician Assistant

## 2015-02-27 ENCOUNTER — Emergency Department (HOSPITAL_COMMUNITY): Payer: Medicaid Other

## 2015-02-27 DIAGNOSIS — Z8659 Personal history of other mental and behavioral disorders: Secondary | ICD-10-CM | POA: Diagnosis not present

## 2015-02-27 DIAGNOSIS — M545 Low back pain, unspecified: Secondary | ICD-10-CM

## 2015-02-27 DIAGNOSIS — J45909 Unspecified asthma, uncomplicated: Secondary | ICD-10-CM | POA: Diagnosis not present

## 2015-02-27 DIAGNOSIS — M25562 Pain in left knee: Secondary | ICD-10-CM | POA: Diagnosis not present

## 2015-02-27 DIAGNOSIS — K219 Gastro-esophageal reflux disease without esophagitis: Secondary | ICD-10-CM | POA: Diagnosis not present

## 2015-02-27 DIAGNOSIS — G8929 Other chronic pain: Secondary | ICD-10-CM | POA: Diagnosis not present

## 2015-02-27 DIAGNOSIS — Z86018 Personal history of other benign neoplasm: Secondary | ICD-10-CM | POA: Insufficient documentation

## 2015-02-27 DIAGNOSIS — Z7952 Long term (current) use of systemic steroids: Secondary | ICD-10-CM | POA: Insufficient documentation

## 2015-02-27 DIAGNOSIS — Z862 Personal history of diseases of the blood and blood-forming organs and certain disorders involving the immune mechanism: Secondary | ICD-10-CM | POA: Insufficient documentation

## 2015-02-27 DIAGNOSIS — Z79899 Other long term (current) drug therapy: Secondary | ICD-10-CM | POA: Insufficient documentation

## 2015-02-27 DIAGNOSIS — Z3202 Encounter for pregnancy test, result negative: Secondary | ICD-10-CM | POA: Insufficient documentation

## 2015-02-27 DIAGNOSIS — Z720 Tobacco use: Secondary | ICD-10-CM | POA: Diagnosis not present

## 2015-02-27 LAB — URINALYSIS, ROUTINE W REFLEX MICROSCOPIC
Bilirubin Urine: NEGATIVE
Glucose, UA: NEGATIVE mg/dL
HGB URINE DIPSTICK: NEGATIVE
Ketones, ur: NEGATIVE mg/dL
Leukocytes, UA: NEGATIVE
Nitrite: NEGATIVE
Protein, ur: NEGATIVE mg/dL
Specific Gravity, Urine: <1.005 — ABNORMAL LOW (ref 1.005–1.030)
UROBILINOGEN UA: 0.2 mg/dL (ref 0.0–1.0)
pH: 5.5 (ref 5.0–8.0)

## 2015-02-27 LAB — POC URINE PREG, ED: Preg Test, Ur: NEGATIVE

## 2015-02-27 MED ORDER — DIAZEPAM 5 MG PO TABS
5.0000 mg | ORAL_TABLET | Freq: Once | ORAL | Status: AC
  Administered 2015-02-27: 5 mg via ORAL
  Filled 2015-02-27: qty 1

## 2015-02-27 NOTE — ED Notes (Signed)
Pt c/o back pain and left knee pain. Pt states she has fallen 3 times in last week. Pt ambulated in triage without assistance.

## 2015-02-27 NOTE — ED Provider Notes (Signed)
CSN: [id number]     Arrival date & time 02/27/15  2113 History   First MD Initiated Contact with Patient 02/27/15 2117     Chief Complaint  Patient presents with  . Back Pain     (Consider location/radiation/quality/duration/timing/severity/associated sxs/prior Treatment) Patient is a 44 y.o. female presenting with back pain. The history is provided by the patient. No language interpreter was used.  Back Pain Location:  Lumbar spine Quality:  Shooting Radiates to:  L posterior upper leg Pain severity:  Severe Pain is:  Unable to specify Onset quality:  Gradual Duration:  1 week Timing:  Constant Progression:  Worsening Chronicity:  Chronic Relieved by:  Nothing Worsened by:  Ambulation, movement and standing Ineffective treatments:  Narcotics and muscle relaxants Associated symptoms: leg pain and numbness   Associated symptoms: no bladder incontinence and no bowel incontinence   Risk factors: obesity    MEIGHAN TRETO is a 44 y.o. female with hx of chronic pain presents to the ED with back pain and left knee pain. She reports having bulging disc that cause her to fall. She reports falling 3 times in the past week. She called her PCP, Dr. Gerarda Fraction, and he suggested she come to the ED. Patient states she has taken Percocet for the pain without relief. Patient states that Dr. Gerarda Fraction manages her pain with Percocet 10/325 mg and Flexeril 10 mg. Patient states she can not take NSAIDS. Patient also complains of left knee pain since her fall.    Past Medical History  Diagnosis Date  . Anxiety   . Chronic back pain   . Anemia   . Asthma   . GERD (gastroesophageal reflux disease)   . Fibroid   . Vaginal Pap smear, abnormal     not had pap smear since 2012   Past Surgical History  Procedure Laterality Date  . Bilateral ankle surgery    . Cesarean section    . Myomectomy abdominal approach    . Multiple tooth extractions     History reviewed. No pertinent family history. Social  History  Substance Use Topics  . Smoking status: Current Every Day Smoker -- 1.00 packs/day  . Smokeless tobacco: None  . Alcohol Use: Yes   OB History    Gravida Para Term Preterm AB TAB SAB Ectopic Multiple Living   5 3   2  2   3      Review of Systems  Gastrointestinal: Negative for bowel incontinence.  Genitourinary: Negative for bladder incontinence.  Musculoskeletal: Positive for back pain.       Left knee pain  Neurological: Positive for numbness.  all other systems negative    Allergies  Gelatin; Pantoprazole; Celebrex; Diclofenac sodium; Escitalopram oxalate; Levaquin; Rofecoxib; Tramadol hcl; and Ultram  Home Medications   Prior to Admission medications   Medication Sig Start Date End Date Taking? Authorizing Provider  albuterol (PROVENTIL HFA;VENTOLIN HFA) 108 (90 BASE) MCG/ACT inhaler Inhale 1-2 puffs into the lungs every 6 (six) hours as needed for wheezing or shortness of breath.    Historical Provider, MD  diphenhydrAMINE (BENADRYL) 25 mg capsule Take 2 capsules (50 mg total) by mouth every 8 (eight) hours as needed for allergies. 12/09/13   Junius Creamer, NP  EPINEPHrine (EPIPEN 2-PAK) 0.3 mg/0.3 mL IJ SOAJ injection Inject 0.3 mLs (0.3 mg total) into the muscle once. 08/12/14   Milton Ferguson, MD  omeprazole (PRILOSEC) 20 MG capsule Take 20 mg by mouth daily.    Historical Provider, MD  oxyCODONE-acetaminophen (PERCOCET) 10-325 MG per tablet Take 1 tablet by mouth every 4 (four) hours as needed for pain.    Historical Provider, MD  oxyCODONE-acetaminophen (PERCOCET/ROXICET) 5-325 MG per tablet Take 1 tablet by mouth every 4 (four) hours as needed for pain. Patient not taking: Reported on 08/12/2014 01/02/13   Carole Civil, MD  predniSONE (DELTASONE) 10 MG tablet Take 2 tablets (20 mg total) by mouth daily. 08/12/14   Milton Ferguson, MD   BP 146/88 mmHg  Pulse 110  Temp(Src) 98.6 F (37 C) (Oral)  Resp 20  Ht 5\' 3"  (1.6 m)  Wt 220 lb (99.791 kg)  BMI 38.98  kg/m2  SpO2 100%  LMP 02/18/2015 Physical Exam  Constitutional: She is oriented to person, place, and time. She appears well-developed and well-nourished. No distress.  Patient lying flat on her back on stretcher and appears comfortable.   HENT:  Head: Normocephalic and atraumatic.  Right Ear: Tympanic membrane normal.  Left Ear: Tympanic membrane normal.  Nose: Nose normal.  Mouth/Throat: Uvula is midline, oropharynx is clear and moist and mucous membranes are normal.  Eyes: Conjunctivae and EOM are normal.  Neck: Normal range of motion. Neck supple.  Cardiovascular: Normal rate and regular rhythm.   Pulmonary/Chest: Effort normal. She has no wheezes. She has no rales.  Abdominal: Soft. Bowel sounds are normal. There is no tenderness.  Musculoskeletal: Normal range of motion.       Left knee: She exhibits normal range of motion, no swelling, no ecchymosis, no deformity, no erythema, normal alignment and normal patellar mobility. Tenderness found. MCL tenderness noted.       Lumbar back: She exhibits tenderness and pain. She exhibits normal range of motion and normal pulse.  Neurological: She is alert and oriented to person, place, and time. She has normal strength. No cranial nerve deficit or sensory deficit. Gait normal.  Reflex Scores:      Bicep reflexes are 2+ on the right side and 2+ on the left side.      Brachioradialis reflexes are 2+ on the right side and 2+ on the left side.      Patellar reflexes are 2+ on the right side and 2+ on the left side.      Achilles reflexes are 2+ on the right side and 2+ on the left side. Straight leg raises without difficulty. Pedal pulses 2+, adequate circulation.   Skin: Skin is warm and dry.  Psychiatric: She has a normal mood and affect. Her behavior is normal.  Nursing note and vitals reviewed.   ED Course  Procedures (including critical care time) Labs Review Results for orders placed or performed during the hospital encounter of  02/27/15 (from the past 24 hour(s))  Urinalysis, Routine w reflex microscopic (not at [facility])     Status: Abnormal   Collection Time: 02/27/15 10:22 PM  Result Value Ref Range   Color, Urine YELLOW YELLOW   APPearance CLEAR CLEAR   Specific Gravity, Urine <1.005 (L) 1.005 - 1.030   pH 5.5 5.0 - 8.0   Glucose, UA NEGATIVE NEGATIVE mg/dL   Hgb urine dipstick NEGATIVE NEGATIVE   Bilirubin Urine NEGATIVE NEGATIVE   Ketones, ur NEGATIVE NEGATIVE mg/dL   Protein, ur NEGATIVE NEGATIVE mg/dL   Urobilinogen, UA 0.2 0.0 - 1.0 mg/dL   Nitrite NEGATIVE NEGATIVE   Leukocytes, UA NEGATIVE NEGATIVE  POC Urine Pregnancy, ED (do NOT order at [facility])     Status: None   Collection Time: 02/27/15 10:28 PM  Result Value Ref Range   Preg Test, Ur NEGATIVE NEGATIVE     Imaging Review Dg Lumbar Spine Complete  02/27/2015   CLINICAL DATA:  Fall 3 times in past week. Low back injury and pain. Initial encounter.  EXAM: LUMBAR SPINE - COMPLETE 4+ VIEW  COMPARISON:  None.  FINDINGS: There is no evidence of lumbar spine fracture. Alignment is normal. Intervertebral disc spaces are maintained. No evidence of facet arthropathy or other significant bone abnormality.  IMPRESSION: Negative lumbar spine radiographs.   Electronically Signed   By: Earle Gell M.D.   On: 02/27/2015 23:08   Dg Knee Complete 4 Views Left  02/27/2015   CLINICAL DATA:  Fall 3 times in past week. Left knee injury and pain. Initial encounter.  EXAM: LEFT KNEE - COMPLETE 4+ VIEW  COMPARISON:  None.  FINDINGS: There is no evidence of fracture, dislocation, or joint effusion. There is no evidence of arthropathy or other focal bone abnormality. Soft tissues are unremarkable.  IMPRESSION: Negative.   Electronically Signed   By: Earle Gell M.D.   On: 02/27/2015 23:07   I have personally reviewed and evaluated these images and lab results as part of my medical decision-making.   MDM  44 y.o. obese female with left knee pain and lower back pain s/p fall a  few days ago. Stable for d/c without acute findings on x-ray and no focal neuro deficits. Ace wrap to left knee and Valium 5 mg PO prior to d/c. Patient to continue to take the Percocet 10mg  and Flexeril 10 mg. And call Dr. Gerarda Fraction in the am. For follow up. Discussed with the patient and all questioned fully answered.   Final diagnoses:  Left knee pain  Lumbar spine painful on movement        Ashley Murrain, NP 02/27/15 [place], [place] 03/01/15 [phone]

## 2015-02-27 NOTE — Discharge Instructions (Signed)
Your x-rays tonight show no broken bones or dislocation. Call Dr. Gerarda Fraction tomorrow for follow up. Continue with the Percocet and Flexeril.

## 2015-02-27 NOTE — ED Notes (Signed)
Pt was able to get up off of bed from lying flat without complaints.  Pt ambulatory to discharge with normal gait

## 2015-03-06 ENCOUNTER — Other Ambulatory Visit (HOSPITAL_COMMUNITY): Payer: Self-pay | Admitting: Physician Assistant

## 2015-03-06 DIAGNOSIS — Z1231 Encounter for screening mammogram for malignant neoplasm of breast: Secondary | ICD-10-CM

## 2015-03-11 ENCOUNTER — Ambulatory Visit (HOSPITAL_COMMUNITY): Payer: Medicaid Other

## 2015-03-25 ENCOUNTER — Ambulatory Visit (HOSPITAL_COMMUNITY): Payer: Medicaid Other

## 2015-04-24 ENCOUNTER — Ambulatory Visit (HOSPITAL_COMMUNITY): Payer: Medicaid Other

## 2016-06-17 ENCOUNTER — Ambulatory Visit (INDEPENDENT_AMBULATORY_CARE_PROVIDER_SITE_OTHER): Payer: Medicaid Other | Admitting: Family Medicine

## 2016-06-17 ENCOUNTER — Encounter: Payer: Self-pay | Admitting: Family Medicine

## 2016-06-17 VITALS — BP 132/89 | HR 111 | Wt 237.5 lb

## 2016-06-17 DIAGNOSIS — N946 Dysmenorrhea, unspecified: Secondary | ICD-10-CM

## 2016-06-17 DIAGNOSIS — N806 Endometriosis in cutaneous scar: Secondary | ICD-10-CM | POA: Diagnosis not present

## 2016-06-17 DIAGNOSIS — Z720 Tobacco use: Secondary | ICD-10-CM

## 2016-06-17 DIAGNOSIS — N920 Excessive and frequent menstruation with regular cycle: Secondary | ICD-10-CM | POA: Diagnosis not present

## 2016-06-17 DIAGNOSIS — R87810 Cervical high risk human papillomavirus (HPV) DNA test positive: Secondary | ICD-10-CM | POA: Insufficient documentation

## 2016-06-17 DIAGNOSIS — D500 Iron deficiency anemia secondary to blood loss (chronic): Secondary | ICD-10-CM | POA: Diagnosis not present

## 2016-06-17 MED ORDER — MEGESTROL ACETATE 40 MG PO TABS: 40.0000 mg | ORAL_TABLET | Freq: Two times a day (BID) | ORAL | 3 refills | Status: DC

## 2016-06-17 NOTE — Assessment & Plan Note (Signed)
Unclear, no findings on exam--but given its relation to C-section??? And pain with cycle only.

## 2016-06-17 NOTE — Patient Instructions (Signed)
Endometrial Ablation Endometrial ablation removes the lining of the uterus (endometrium). It is usually a same-day, outpatient treatment. Ablation helps avoid major surgery, such as surgery to remove the cervix and uterus (hysterectomy). After endometrial ablation, you will have little or no menstrual bleeding and may not be able to have children. However, if you are premenopausal, you will need to use a reliable method of birth control following the procedure because of the small chance that pregnancy can occur. There are different reasons to have this procedure. These reasons include:  Heavy periods.  Bleeding that is causing anemia.  Irregular bleeding.  Bleeding fibroids on the lining inside the uterus if they are smaller than 3 centimeters. This procedure may not be possible for you if:   You want to have children in the future.   You have severe cramps with your menstrual period.   You have precancerous or cancerous cells in your uterus.   You were recently pregnant.   You have gone through menopause.   You have had major surgery on your uterus, resulting in thinning of the uterine wall. Surgeries may include:  The removal of one or more uterine fibroids (myomectomy).  A cesarean section with a classic (vertical) incision on your uterus. Ask your health care provider what type of cesarean you had. Sometimes the scar on your skin is different than the scar on your uterus. Even if you have had surgery on your uterus, certain types of ablation may still be safe for you. Talk with your health care provider. LET YOUR HEALTH CARE PROVIDER KNOW ABOUT:  Any allergies you have.  All medicines you are taking, including vitamins, herbs, eye drops, creams, and over-the-counter medicines.  Previous problems you or members of your family have had with the use of anesthetics.  Any blood disorders you have.  Previous surgeries you have had.  Medical conditions you have. RISKS AND  COMPLICATIONS  Generally, this is a safe procedure. However, as with any procedure, complications can occur. Possible complications include:  Perforation of the uterus.  Bleeding.  Infection of the uterus, bladder, or vagina.  Injury to surrounding organs.  An air bubble to the lung (air embolus).  Pregnancy following the procedure.  Failure of the procedure to help the problem, requiring hysterectomy.  Decreased ability to diagnose cancer in the lining of the uterus. BEFORE THE PROCEDURE  The lining of the uterus must be tested to make sure there is no pre-cancerous or cancer cells present.  An ultrasound may be performed to look at the size of the uterus and to check for abnormalities.  Medicines may be given to thin the lining of the uterus. PROCEDURE  During the procedure, your health care provider will use a tool called a resectoscope to help see inside your uterus. There are different ways to remove the lining of your uterus.   Radiofrequency - This method uses a radiofrequency-alternating electric current to remove the lining of the uterus.  Cryotherapy - This method uses extreme cold to freeze the lining of the uterus.  Heated-Free Liquid - This method uses heated salt (saline) solution to remove the lining of the uterus.  Microwave - This method uses high-energy microwaves to heat up the lining of the uterus to remove it.  Thermal balloon - This method involves inserting a catheter with a balloon tip into the uterus. The balloon tip is filled with heated fluid to remove the lining of the uterus. AFTER THE PROCEDURE  After your procedure, do   not have sexual intercourse or insert anything into your vagina until permitted by your health care provider. After the procedure, you may experience:  Cramps.  Vaginal discharge.  Frequent urination. This information is not intended to replace advice given to you by your health care provider. Make sure you discuss any  questions you have with your health care provider. Document Released: 05/08/2004 Document Revised: 03/20/2015 Document Reviewed: 11/30/2012 Elsevier Interactive Patient Education  2017 Elsevier Inc.  

## 2016-06-17 NOTE — Progress Notes (Signed)
Subjective:    Patient ID: Sonya Berry is a 45 y.o. female presenting with Discuss Abdominal Pain  on 06/17/2016  HPI: Patient is a [id number] with one prior c-section due to back pain. Would like to pursue hysterectomy. Anemia is worse. Reports very heavy cycles. Hgb was 7. Has needed iron infusion. Reports abnormal pap smear and colposcopy in [place] this year. Reports u/s done for pain and notes small cyst on right ovary. Pain worse in 2015. Notes pain in C-section scar since C-section on left side. 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 Interested in hysterectomy. Has chronic back pain and is on 4-5 Percocets/day from pain clinic.  Review of Systems  Constitutional: Negative for chills and fever.  Respiratory: Negative for shortness of breath.   Cardiovascular: Negative for chest pain.  Gastrointestinal: Negative for abdominal pain, nausea and vomiting.  Genitourinary: Negative for dysuria.  Skin: Negative for rash.      Objective:    BP 132/89   Pulse (!) 111   Wt 237 lb 8 oz (107.7 kg)   BMI 42.07 kg/m  Physical Exam  Constitutional: She is oriented to person, place, and time. She appears well-developed and well-nourished. No distress.  HENT:  Head: Normocephalic and atraumatic.  Eyes: No scleral icterus.  Neck: Neck supple.  Cardiovascular: Normal rate.   Pulmonary/Chest: Effort normal.  Abdominal: Soft.  Neurological: She is alert and oriented to person, place, and time.  Skin: Skin is warm and dry.  Psychiatric: She has a normal mood and affect.   Pelvic sono 12/15 Uterus Measurements: 9.1 x 6.6 x 5.1 cm, anteverted, anteflexed. No fibroids or other mass visualized.  Endometrium Thickness: 5 mm, minimal inhomogeneity at the level of the C-section scar but no significant change allowing for differences in technique. No focal abnormality  visualized.  Right ovary Measurements: 6.0 x 5.0 x 5.0 cm. Simple appearing cyst measures 5.1 x 4.4 x 4.1 cm.  Left ovary Measurements: 4.0 x 2.2 x 1.7 cm. Normal appearance/no adnexal mass.  No free fluid.  IMPRESSION: Allowing for mild inhomogeneity of the endometrial stripe at the level of the C-section scar, no focal abnormality or acute finding.  Simple appearing right ovarian cyst. This is almost certainly Benign.    Assessment & Plan:   Problem List Items Addressed This Visit      Unprioritized   Tobacco use   Menorrhagia    Given her previous surgery and scars--risks are high, also she is a heavy smoker and obese. I have recommended against hysterectomy at this time, in favor or more conservative therapy. Offered IUD, but she is more interested in endometrial ablation. Will schedule for D & C with hysteroscopy and HTA with sampling at that time. If this fails may consider hysterectomy--? Route.      Relevant Medications   megestrol (MEGACE) 40 MG tablet   Anemia due to chronic blood loss   ? of Endometriosis in cutaneous scar    Unclear, no findings on exam--but given its relation to C-section??? And pain with cycle only.      Cervical high risk HPV (human papillomavirus) test positive    Other Visit Diagnoses    Dysmenorrhea    -  Primary      Total face-to-face time with patient: 30 minutes. Over 50% of encounter was spent on counseling and coordination of care. Return in about 3 months (around  09/15/2016) for postop check.  [name] 06/17/2016 8:48 AM

## 2016-06-17 NOTE — Assessment & Plan Note (Addendum)
Given her previous surgery and scars--risks are high, also she is a heavy smoker and obese. I have recommended against hysterectomy at this time, in favor or more conservative therapy. Offered IUD, but she is more interested in endometrial ablation. Will schedule for D & C with hysteroscopy and HTA with sampling at that time. If this fails may consider hysterectomy--? Route.

## 2016-06-19 ENCOUNTER — Encounter (HOSPITAL_COMMUNITY): Payer: Self-pay | Admitting: *Deleted

## 2016-06-29 ENCOUNTER — Telehealth: Payer: Self-pay | Admitting: *Deleted

## 2016-06-29 NOTE — Telephone Encounter (Signed)
Pt called with question regarding her surgery (endometrial ablation) which is scheduled for 12/29.  She states that she has had 2 periods this montha and this second episode has lasted 8 days thus far.  The first 4 days were heavy and now she is only having light bleeding. She wants to know if this will affect her surgery in any way.  I told pt that I will obtain an answer to her question and call he back this afternoon. Pt agreed and voiced understanding stating that a detailed message can be left on her voice mail if she does not answer.   [place] pt and left message stating that I have spoken with one of Dr. Virginia Crews colleagues.  He stated that her bleeding will not keep her from having surgery and we are happy that the bleeding is now lighter. I also stated that she should be taking the Megestrol (Megace) 40 mg twice daily as prescribed by Dr. Kennon Rounds @ her last visit. If she has any questions she may call back.

## 2016-07-08 ENCOUNTER — Encounter (HOSPITAL_COMMUNITY): Payer: Self-pay | Admitting: *Deleted

## 2016-08-03 ENCOUNTER — Telehealth: Payer: Self-pay | Admitting: *Deleted

## 2016-08-03 ENCOUNTER — Encounter: Payer: Self-pay | Admitting: *Deleted

## 2016-08-03 NOTE — Telephone Encounter (Signed)
Sonya Berry called today and left a voice message she is scheduled for surgery 09/07/16 and wants to know if Dr. Kennon Rounds can use a spinal instead of putting her to sleep. States she is afraid of being put to sleep.

## 2016-08-03 NOTE — Telephone Encounter (Signed)
Discussed with Dr. Kennon Rounds usually anesthesia does put patient " under". Patient can discuss with anesthesia at her preop appt.  I called [place] and left a message I was returning her call, please call office back.

## 2016-08-05 NOTE — Telephone Encounter (Signed)
I called Sonya Berry and we discussed per Dr. Kennon Rounds normally you are put to sleep; but anesthesia is who decides which anesthesia  To use and she can discuss that with them at her preop appt. She is unaware of when her preop appt is so I gave her ob/gyn scheduling office number to discuss with them. She voices understanding.

## 2016-08-25 ENCOUNTER — Telehealth: Payer: Self-pay | Admitting: *Deleted

## 2016-08-25 NOTE — Telephone Encounter (Signed)
Patient left message on nurse line on 08/24/16 at 1540.  Patient states she is scheduled for surgery on 09/07/16 with Dr. Kennon Rounds.  States she wants to cancel this surgery as she is "not physically healthy to undergo surgery like that".  States she doesn't know if she will reschedule.  Will send message to [facility] to cancel surgery for patient.

## 2016-09-07 ENCOUNTER — Encounter (HOSPITAL_COMMUNITY): Admission: RE | Payer: Self-pay | Source: Ambulatory Visit

## 2016-09-07 ENCOUNTER — Ambulatory Visit (HOSPITAL_COMMUNITY): Admission: RE | Admit: 2016-09-07 | Payer: Medicaid Other | Source: Ambulatory Visit | Admitting: Family Medicine

## 2016-09-07 SURGERY — ABLATION, ENDOMETRIUM, HYSTEROSCOPIC
Anesthesia: Choice | Site: Vagina

## 2017-01-21 ENCOUNTER — Telehealth: Payer: Self-pay | Admitting: *Deleted

## 2017-01-21 NOTE — Telephone Encounter (Addendum)
Pt left message stating that she is having very heavy bleeding with her period and wants a prescription for medication to slow down the bleeding. She was scheduled for surgery in February but had to cancel because of health problems. I called pt back after consult with Dr. Kennon Rounds and left a message. I asked her to call back and state whether a detailed message can be left on her voice mail. I have information from the doctor.   7/13  1020  Pt left message yesterday @ 1915 stating that a message can be left on her voice mail. I called pt and left message stating that she has available refills of Megace @ [facility] in [place] [phone]. She can call the pharmacy to request a refill or to have the Rx transferred to a different pharmacy if needed. Dr. Kennon Rounds would like her to take 1 tablet twice daily during her heavy period, then stop medication when the bleeding stops. She may call back if she has additional questions.

## 2018-02-21 ENCOUNTER — Ambulatory Visit: Payer: Self-pay | Admitting: Family Medicine

## 2018-02-21 ENCOUNTER — Telehealth: Payer: Self-pay | Admitting: Family Medicine

## 2018-02-21 NOTE — Telephone Encounter (Signed)
Patient called to say she would not be able to make in today. She is requesting a possible telephone consult. Due to her  upper respiratory infection, and always getting sick. Please advise patient on what she could do.

## 2018-02-22 NOTE — Telephone Encounter (Signed)
Attempted to call patient but number was busy x 3

## 2018-03-22 ENCOUNTER — Ambulatory Visit (INDEPENDENT_AMBULATORY_CARE_PROVIDER_SITE_OTHER): Payer: Medicaid Other | Admitting: Family Medicine

## 2018-03-22 ENCOUNTER — Encounter: Payer: Self-pay | Admitting: Family Medicine

## 2018-03-22 VITALS — BP 134/89 | HR 111 | Ht 64.0 in | Wt 230.0 lb

## 2018-03-22 DIAGNOSIS — N920 Excessive and frequent menstruation with regular cycle: Secondary | ICD-10-CM | POA: Diagnosis not present

## 2018-03-22 DIAGNOSIS — D500 Iron deficiency anemia secondary to blood loss (chronic): Secondary | ICD-10-CM

## 2018-03-22 NOTE — Progress Notes (Signed)
   Subjective:    Patient ID: Sonya Berry is a 47 y.o. female presenting with New Patient (Initial Visit) (discuss needs for hyst) and Consult  on 03/22/2018  HPI: Reports weight gain with Megace. Feels like her stomach grows when she is on her period. Reports feeling sick all the time and takes care of kids. Cycle is heavy and and lasting 2 wks. Has h/o iron infusion for abnl bleeding. Also reports leg pain, lower abdominal and groin pain with cycles. There is a ? Of endometriosis in her C-section scar..though that has not been confirmed. Last u/s in 2015 was mostly normal excpet for simple appearing right ovarian cyst.  Review of Systems  Constitutional: Negative for chills and fever.  HENT: Positive for congestion and rhinorrhea.   Respiratory: Negative for shortness of breath.   Cardiovascular: Negative for chest pain.  Gastrointestinal: Negative for abdominal pain, nausea and vomiting.  Genitourinary: Positive for vaginal bleeding. Negative for dysuria.  Skin: Negative for rash.      Objective:    BP 134/89   Pulse (!) 111   Ht 5\' 4"  (1.626 m)   Wt 230 lb (104.3 kg)   LMP 03/20/2018   BMI 39.48 kg/m  Physical Exam  Constitutional: She is oriented to person, place, and time. She appears well-developed and well-nourished. She has a sickly appearance. No distress.  HENT:  Head: Normocephalic and atraumatic.  Eyes: No scleral icterus.  Neck: Neck supple.  Cardiovascular: Normal rate.  Pulmonary/Chest: Effort normal.  Abdominal: Soft.  Neurological: She is alert and oriented to person, place, and time.  Skin: Skin is warm and dry.  Psychiatric: She has a normal mood and affect.        Assessment & Plan:   Problem List Items Addressed This Visit      Unprioritized   Menorrhagia - Primary    Has not had full w/u in some time. Will check EMB, TVUS and needs pap smear. She had mammogram at [facility]. Strongly desires hysterectomy. Lengthy discussion with patient on  R/B/A to this procedure. 1 prior C-section, and she is a heavy smoker. Previously offered her IUD and endometrial ablation. Megace is not a great option due to weight gain. She is unwilling to do anything less than hysterectomy. Will book for [facility] once her results from the above tests are complete.      Relevant Orders   US PELVIC COMPLETE WITH TRANSVAGINAL   Anemia due to chronic blood loss    Continue iron          Total face-to-face time with patient: 25 minutes. Over 50% of encounter was spent on counseling and coordination of care. Return in about 4 weeks (around 04/19/2018) for needs pap and endometrial biopsy.  Sonya Berry 03/22/2018 10:39 AM

## 2018-03-22 NOTE — Patient Instructions (Signed)
Menorrhagia Menorrhagia is when your menstrual periods are heavy or last longer than usual. Follow these instructions at home:  Only take medicine as told by your doctor.  Take any iron pills as told by your doctor. Heavy bleeding may cause low levels of iron in your body.  Do not take aspirin 1 week before or during your period. Aspirin can make the bleeding worse.  Lie down for a while if you change your tampon or pad more than once in 2 hours. This may help lessen the bleeding.  Eat a healthy diet and foods with iron. These foods include leafy green vegetables, meat, liver, eggs, and whole grain breads and cereals.  Do not try to lose weight. Wait until the heavy bleeding has stopped and your iron level is normal. Contact a doctor if:  You soak through a pad or tampon every 1 or 2 hours, and this happens every time you have a period.  You need to use pads and tampons at the same time because you are bleeding so much.  You need to change your pad or tampon during the night.  You have a period that lasts for more than 8 days.  You pass clots bigger than 1 inch (2.5 cm) wide.  You have irregular periods that happen more or less often than once a month.  You feel dizzy or pass out (faint).  You feel very weak or tired.  You feel short of breath or feel your heart is beating too fast when you exercise.  You feel sick to your stomach (nausea) and you throw up (vomit) while you are taking your medicine.  You have watery poop (diarrhea) while you are taking your medicine.  You have any problems that may be related to the medicine you are taking. Get help right away if:  You soak through 4 or more pads or tampons in 2 hours.  You have any bleeding while you are pregnant. This information is not intended to replace advice given to you by your health care provider. Make sure you discuss any questions you have with your health care provider. Document Released: 04/07/2008 Document  Revised: 12/05/2015 Document Reviewed: 12/29/2012 Elsevier Interactive Patient Education  2017 [employer].  

## 2018-03-22 NOTE — Progress Notes (Signed)
Up to date on Mammogram- had yesterday at [facility]. Pt is up to date on Pap within last year.  Last pap abnormal, had colpo- normal. Painful cycles, last up to 2 weeks. Pt will use a full pack of pads in the first 3 days. Pt states hyst has been recommened, by Hematologist.

## 2018-03-23 ENCOUNTER — Encounter: Payer: Self-pay | Admitting: Family Medicine

## 2018-03-23 NOTE — Assessment & Plan Note (Signed)
Has not had full w/u in some time. Will check EMB, TVUS and needs pap smear. She had mammogram at [facility]. Strongly desires hysterectomy. Lengthy discussion with patient on R/B/A to this procedure. 1 prior C-section, and she is a heavy smoker. Previously offered her IUD and endometrial ablation. Megace is not a great option due to weight gain. She is unwilling to do anything less than hysterectomy. Will book for [facility] once her results from the above tests are complete.

## 2018-03-23 NOTE — Assessment & Plan Note (Signed)
Continue iron  

## 2018-03-31 ENCOUNTER — Ambulatory Visit: Payer: Self-pay | Admitting: Family Medicine

## 2018-04-04 ENCOUNTER — Encounter: Payer: Self-pay | Admitting: *Deleted

## 2018-04-19 ENCOUNTER — Ambulatory Visit (HOSPITAL_COMMUNITY)
Admission: RE | Admit: 2018-04-19 | Discharge: 2018-04-19 | Disposition: A | Discharge status: Home / Self Care | Payer: Medicaid Other | Source: Ambulatory Visit | Attending: Family Medicine | Admitting: Family Medicine

## 2018-04-19 DIAGNOSIS — N920 Excessive and frequent menstruation with regular cycle: Secondary | ICD-10-CM

## 2018-04-19 DIAGNOSIS — N83202 Unspecified ovarian cyst, left side: Secondary | ICD-10-CM | POA: Diagnosis not present

## 2018-04-25 ENCOUNTER — Ambulatory Visit (INDEPENDENT_AMBULATORY_CARE_PROVIDER_SITE_OTHER): Payer: Medicaid Other | Admitting: Family Medicine

## 2018-04-25 ENCOUNTER — Other Ambulatory Visit (HOSPITAL_COMMUNITY)
Admission: RE | Admit: 2018-04-25 | Discharge: 2018-04-25 | Disposition: A | Discharge status: Home / Self Care | Payer: Medicaid Other | Source: Ambulatory Visit | Attending: Family Medicine | Admitting: Family Medicine

## 2018-04-25 ENCOUNTER — Encounter: Payer: Self-pay | Admitting: Family Medicine

## 2018-04-25 VITALS — BP 164/100 | Wt 230.4 lb

## 2018-04-25 DIAGNOSIS — R102 Pelvic and perineal pain: Secondary | ICD-10-CM | POA: Diagnosis not present

## 2018-04-25 DIAGNOSIS — Z124 Encounter for screening for malignant neoplasm of cervix: Secondary | ICD-10-CM | POA: Diagnosis not present

## 2018-04-25 DIAGNOSIS — D500 Iron deficiency anemia secondary to blood loss (chronic): Secondary | ICD-10-CM | POA: Diagnosis not present

## 2018-04-25 DIAGNOSIS — N920 Excessive and frequent menstruation with regular cycle: Secondary | ICD-10-CM

## 2018-04-25 LAB — POCT PREGNANCY, URINE: PREG TEST UR: NEGATIVE

## 2018-04-25 MED ORDER — METHOCARBAMOL 500 MG PO TABS: 500.0000 mg | ORAL_TABLET | Freq: Four times a day (QID) | ORAL | 0 refills | Status: DC

## 2018-04-25 NOTE — Patient Instructions (Signed)

## 2018-04-25 NOTE — Progress Notes (Addendum)
Subjective:    Patient ID: Sonya Berry is a 47 y.o. female presenting with Follow-up  on 04/25/2018  HPI: Here today for f/u. Previously seen by me on several occasions with complaints of pain and bleeding. Recent u/s done and she has had significant pain and cramping since that time. She has been counseled about risks of surgery and alternatives--has refused IUD and been scheduled for endometrial ablation and has canceled this. Has needed iron infusions in the past. Last Hgb 10.1. Due for another iron infusion. She has been on Megace, and did not work due to weight gain.  She strongly desires hysterectomy. Has h/o 1 prior c-section. U/s c/w adenomyosis. Needs pap and endometrial sampling today. Continues to smoke.  Review of Systems  Constitutional: Negative for chills and fever.  Respiratory: Negative for shortness of breath.   Cardiovascular: Negative for chest pain.  Gastrointestinal: Positive for abdominal pain. Negative for nausea and vomiting.  Genitourinary: Positive for menstrual problem. Negative for dysuria.       Some minor incontinence with sneezing and coughing  Skin: Negative for rash.      Objective:    BP (!) 164/100 (BP Location: Right Arm, Patient Position: Sitting) Comment: manual  Wt 230 lb 6.4 oz (104.5 kg)   BMI 39.55 kg/m  Physical Exam  Constitutional: She is oriented to person, place, and time. She appears well-developed and well-nourished. No distress.  HENT:  Head: Normocephalic and atraumatic.  Eyes: No scleral icterus.  Neck: Neck supple.  Cardiovascular: Normal rate.  Pulmonary/Chest: Effort normal.  Abdominal: Soft.  Genitourinary:  Genitourinary Comments: BUS normal, vagina is pink and rugated, cervix is multiparous without lesion, uterus is small and anteverted, no adnexal mass or tenderness. Exam limited by body hapitus   Neurological: She is alert and oriented to person, place, and time.  Skin: Skin is warm and dry.  Psychiatric: She  has a normal mood and affect.   Procedure: Patient given informed consent, signed copy in the chart, time out was performed. Appropriate time out taken. The patient was placed in the lithotomy position and the cervix brought into view with sterile speculum.  Portio of cervix cleansed x 2 with betadine swabs.  A tenaculum was placed in the anterior lip of the cervix.  The uterus was sounded for depth of 7 cm. A pipelle was introduced to into the uterus, suction created,  and an endometrial sample was obtained. All equipment was removed and accounted for.  The patient tolerated the procedure well.   Patient given post procedure instructions. The patient will return in 2 weeks for results.     Assessment & Plan:   Problem List Items Addressed This Visit      Unprioritized   Menorrhagia - Primary    + pelvic pain. U/s looks like adenomyosis. She continues to want hysterectomy. Will attempt TVH. Risks include but are not limited to bleeding, infection, injury to surrounding structures, including bowel, bladder and ureters, blood clots, and death.  Likelihood of success is high. Risks greater with prior C-section and ex lap for serous cystadenoma removal. Should attempt to quit smoking 1 wk prior to procedure. Discussed poor healing and worsening anesthesia outcomes, if she doesn't.  Check pap and EMB today prior to procedure being scheduled.       Relevant Orders   Pregnancy, urine POC (Completed)   Surgical pathology   Anemia due to chronic blood loss    Continue iron infusions.  Pelvic pain    Likely adenomyosis. Will give trial of Robaxin. Already on 4-5 percocets daily from chronic pain clinic. Allergic to NSAIDS. Will not give narcs or those. On flexeril, hold while taking Robaxin.      Relevant Medications   methocarbamol (ROBAXIN) 500 MG tablet    Other Visit Diagnoses    Screening for cervical cancer       Relevant Orders   Cytology - PAP      Total face-to-face time  with patient: 15 minutes. Over 50% of encounter was spent on counseling and coordination of care. Return in about 3 months (around 07/26/2018) for postop check.  [name] 04/25/2018 1:50 PM

## 2018-04-25 NOTE — Assessment & Plan Note (Addendum)
+   pelvic pain. U/s looks like adenomyosis. She continues to want hysterectomy. Will attempt TVH. Risks include but are not limited to bleeding, infection, injury to surrounding structures, including bowel, bladder and ureters, blood clots, and death.  Likelihood of success is high. Risks greater with prior C-section and ex lap for serous cystadenoma removal. Should attempt to quit smoking 1 wk prior to procedure. Discussed poor healing and worsening anesthesia outcomes, if she doesn't.  Check pap and EMB today prior to procedure being scheduled.

## 2018-04-25 NOTE — Addendum Note (Signed)
Addended by: [name] on: 04/25/2018 02:52 PM   Modules accepted: Orders

## 2018-04-25 NOTE — Assessment & Plan Note (Addendum)
Likely adenomyosis. Will give trial of Robaxin. Already on 4-5 percocets daily from chronic pain clinic. Allergic to NSAIDS. Will not give narcs or those. On flexeril, hold while taking Robaxin.

## 2018-04-25 NOTE — Assessment & Plan Note (Signed)
Continue iron infusions

## 2018-04-26 ENCOUNTER — Encounter (HOSPITAL_COMMUNITY): Payer: Self-pay

## 2018-04-26 ENCOUNTER — Encounter: Payer: Self-pay | Admitting: *Deleted

## 2018-04-29 NOTE — Patient Instructions (Addendum)
Your procedure is scheduled on: Monday, 11/4  Enter through the Main Entrance of [facility] at: 9:15 am  Pick up the phone at the desk and dial [phone].  Call this number if you have problems the morning of surgery: [phone].  Remember: Do NOT eat food or Do NOT drink clear liquids (including water) after midnight Sunday  Take these medicines the morning of surgery with a SIP OF WATER: gabapentin and omeprazole  Brush your teeth on the day of surgery.  Bring albuterol inhaler with you on day of surgery.  Do Not smoke on the day of surgery.  Stop herbal medications, vitamin supplements, Ibuprofen/NSAIDS at this time.  Do NOT wear jewelry (body piercing), metal hair clips/bobby pins, make-up, or nail polish. Do NOT wear lotions, powders, or perfumes.  You may wear deoderant. Do NOT shave for 48 hours prior to surgery. Do NOT bring valuables to the hospital.   Leave suitcase in car.  After surgery it may be brought to your room.  For patients admitted to the hospital, checkout time is 11:00 AM the day of discharge. Home with Husband Sonya Berry cell [phone].

## 2018-04-30 LAB — CYTOLOGY - PAP
Diagnosis: NEGATIVE
HPV (WINDOPATH): NOT DETECTED

## 2018-05-04 NOTE — H&P (Signed)
  Sonya Berry is an 47 y.o. [phone] female.    Chief Complaint: menorrhagia and pelvic pain  HPI: Has h/o pelvic pain and bleeding. Has refused IUD and been scheduled for endometrial ablation and has canceled this. Has needed iron infusions in the past. Last Hgb 10.1. She has been on Megace, and did not work due to weight gain.  She strongly desires hysterectomy. Has h/o 1 prior c-section. U/s c/w adenomyosis. Nml pap and EMB.  Past Medical History:  Diagnosis Date  . Anemia   . Anxiety   . Asthma   . Chronic back pain   . Fibroid   . GERD (gastroesophageal reflux disease)   . Vaginal Pap smear, abnormal    not had pap smear since 2012    Past Surgical History:  Procedure Laterality Date  . bilateral ankle surgery    . CESAREAN SECTION    . EXPLORATORY LAPAROTOMY  2006   ovarian cyst removal  . MULTIPLE TOOTH EXTRACTIONS      No family history on file. Social History:  reports that she has been smoking. She has been smoking about 1.00 pack per day. She has never used smokeless tobacco. She reports that she drank alcohol. She reports that she does not use drugs.  Allergies:  Allergies  Allergen Reactions  . Gelatin Anaphylaxis  . Pantoprazole Anaphylaxis  . Celebrex [Celecoxib] Other (See Comments)    Joints freeze up  . Diclofenac Sodium Other (See Comments)    REACTION: jittery, chest pains; anxiety; unable to void  . Escitalopram Oxalate Other (See Comments)    REACTION: jittery; anuria; hyperirritability  . Levaquin [Levofloxacin In D5w] Hives  . Rofecoxib Other (See Comments)    Joints freeze up  . Ultram [Tramadol] Nausea And Vomiting    No medications prior to admission.    A comprehensive review of systems was negative.  There were no vitals taken for this visit. General appearance: alert, cooperative and appears stated age Head: Normocephalic, without obvious abnormality, atraumatic Neck: supple, symmetrical, trachea midline Lungs: normal  effort Heart: regular rate and rhythm Abdomen: soft, non-tender; bowel sounds normal; no masses,  no organomegaly Extremities: extremities normal, atraumatic, no cyanosis or edema Skin: Skin color, texture, turgor normal. No rashes or lesions Neurologic: Grossly normal   Lab Results  Component Value Date   WBC 10.1 07/02/2014   HGB 8.2 (L) 07/02/2014   HCT 29.2 (L) 07/02/2014   MCV 66.2 (L) 07/02/2014   PLT 475 (H) 07/02/2014   Lab Results  Component Value Date   PREGTESTUR NEGATIVE 04/25/2018   Ultrasound: Measurements: 8.6 x 5.4 x 6.2 cm. Diffusely heterogeneous echotexture throughout the uterus. C-section scar noted anteriorly. No focal measurable fibroid Endometrium Thickness: 7 mm in thickness, somewhat difficult to visualize due to heterogeneity throughout the uterus. No focal abnormality visualized.  Assessment/Plan Principal Problem:   Menorrhagia Active Problems:   Anemia due to chronic blood loss   Pelvic pain  For TVH with Bilateral salpingectomy Risks include but are not limited to bleeding, infection, injury to surrounding structures, including bowel, bladder and ureters, blood clots, and death.  Likelihood of success is high.    Sonya Berry 05/04/2018, 5:43 PM

## 2018-05-06 ENCOUNTER — Encounter (HOSPITAL_COMMUNITY): Payer: Self-pay

## 2018-05-06 ENCOUNTER — Encounter (HOSPITAL_COMMUNITY)
Admission: RE | Admit: 2018-05-06 | Discharge: 2018-05-06 | Disposition: A | Discharge status: Home / Self Care | Payer: Medicaid Other | Source: Ambulatory Visit | Attending: Family Medicine | Admitting: Family Medicine

## 2018-05-06 ENCOUNTER — Other Ambulatory Visit: Payer: Self-pay

## 2018-05-06 DIAGNOSIS — Z01812 Encounter for preprocedural laboratory examination: Secondary | ICD-10-CM | POA: Insufficient documentation

## 2018-05-06 HISTORY — DX: Other specified personal risk factors, not elsewhere classified: Z91.89

## 2018-05-06 HISTORY — DX: Sleep apnea, unspecified: G47.30

## 2018-05-06 LAB — CBC
HCT: 35.9 % — ABNORMAL LOW (ref 36.0–46.0)
HEMOGLOBIN: 11.4 g/dL — AB (ref 12.0–15.0)
MCH: 25.7 pg — ABNORMAL LOW (ref 26.0–34.0)
MCHC: 31.8 g/dL (ref 30.0–36.0)
MCV: 80.9 fL (ref 80.0–100.0)
NRBC: 0 % (ref 0.0–0.2)
PLATELETS: 435 10*3/uL — AB (ref 150–400)
RBC: 4.44 MIL/uL (ref 3.87–5.11)
RDW: 16 % — ABNORMAL HIGH (ref 11.5–15.5)
WBC: 10.3 10*3/uL (ref 4.0–10.5)

## 2018-05-13 HISTORY — PX: ABDOMINAL HYSTERECTOMY: SHX81

## 2018-05-15 MED ORDER — CEFAZOLIN SODIUM-DEXTROSE 2-4 GM/100ML-% IV SOLN
2.0000 g | INTRAVENOUS | Status: AC
  Administered 2018-05-16: 2 g via INTRAVENOUS

## 2018-05-16 ENCOUNTER — Encounter (HOSPITAL_COMMUNITY): Payer: Self-pay | Admitting: Anesthesiology

## 2018-05-16 ENCOUNTER — Other Ambulatory Visit: Payer: Self-pay

## 2018-05-16 ENCOUNTER — Ambulatory Visit (HOSPITAL_COMMUNITY)
Admission: AD | Admit: 2018-05-16 | Discharge: 2018-05-17 | Disposition: A | Discharge status: Home / Self Care | Payer: Medicaid Other | Source: Ambulatory Visit | Attending: Family Medicine | Admitting: Family Medicine

## 2018-05-16 ENCOUNTER — Encounter (HOSPITAL_COMMUNITY): Payer: Self-pay

## 2018-05-16 ENCOUNTER — Ambulatory Visit (HOSPITAL_COMMUNITY): Payer: Medicaid Other | Admitting: Anesthesiology

## 2018-05-16 ENCOUNTER — Encounter (HOSPITAL_COMMUNITY)
Admission: AD | Disposition: A | Discharge status: Home / Self Care | Payer: Self-pay | Source: Ambulatory Visit | Attending: Family Medicine

## 2018-05-16 DIAGNOSIS — Z6841 Body Mass Index (BMI) 40.0 and over, adult: Secondary | ICD-10-CM | POA: Diagnosis not present

## 2018-05-16 DIAGNOSIS — N920 Excessive and frequent menstruation with regular cycle: Secondary | ICD-10-CM | POA: Diagnosis present

## 2018-05-16 DIAGNOSIS — N888 Other specified noninflammatory disorders of cervix uteri: Secondary | ICD-10-CM | POA: Insufficient documentation

## 2018-05-16 DIAGNOSIS — D259 Leiomyoma of uterus, unspecified: Secondary | ICD-10-CM | POA: Insufficient documentation

## 2018-05-16 DIAGNOSIS — R102 Pelvic and perineal pain: Secondary | ICD-10-CM | POA: Diagnosis not present

## 2018-05-16 DIAGNOSIS — D500 Iron deficiency anemia secondary to blood loss (chronic): Secondary | ICD-10-CM | POA: Diagnosis not present

## 2018-05-16 DIAGNOSIS — G4730 Sleep apnea, unspecified: Secondary | ICD-10-CM | POA: Diagnosis not present

## 2018-05-16 DIAGNOSIS — N840 Polyp of corpus uteri: Secondary | ICD-10-CM | POA: Diagnosis not present

## 2018-05-16 DIAGNOSIS — I10 Essential (primary) hypertension: Secondary | ICD-10-CM | POA: Insufficient documentation

## 2018-05-16 DIAGNOSIS — F17210 Nicotine dependence, cigarettes, uncomplicated: Secondary | ICD-10-CM | POA: Insufficient documentation

## 2018-05-16 DIAGNOSIS — K219 Gastro-esophageal reflux disease without esophagitis: Secondary | ICD-10-CM | POA: Insufficient documentation

## 2018-05-16 DIAGNOSIS — Z90710 Acquired absence of both cervix and uterus: Secondary | ICD-10-CM | POA: Diagnosis present

## 2018-05-16 HISTORY — PX: VAGINAL HYSTERECTOMY: SHX2639

## 2018-05-16 LAB — PREGNANCY, URINE: Preg Test, Ur: NEGATIVE

## 2018-05-16 SURGERY — HYSTERECTOMY, VAGINAL
Anesthesia: General

## 2018-05-16 MED ORDER — MIDAZOLAM HCL 2 MG/2ML IJ SOLN
INTRAMUSCULAR | Status: AC
  Filled 2018-05-16: qty 2

## 2018-05-16 MED ORDER — SUGAMMADEX SODIUM 200 MG/2ML IV SOLN
INTRAVENOUS | Status: DC | PRN
  Administered 2018-05-16: 215 mg via INTRAVENOUS

## 2018-05-16 MED ORDER — 0.9 % SODIUM CHLORIDE (POUR BTL) OPTIME
TOPICAL | Status: DC | PRN
  Administered 2018-05-16: 1000 mL

## 2018-05-16 MED ORDER — HYDROMORPHONE HCL 2 MG/ML IJ SOLN
2.0000 mg | INTRAMUSCULAR | Status: DC | PRN
  Administered 2018-05-16 – 2018-05-17 (×3): 2 mg via INTRAVENOUS
  Filled 2018-05-16 (×3): qty 1

## 2018-05-16 MED ORDER — LACTATED RINGERS IV SOLN
INTRAVENOUS | Status: DC
  Administered 2018-05-16: 125 mL/h via INTRAVENOUS
  Administered 2018-05-16: 12:00:00 via INTRAVENOUS

## 2018-05-16 MED ORDER — LIDOCAINE HCL (CARDIAC) PF 100 MG/5ML IV SOSY
PREFILLED_SYRINGE | INTRAVENOUS | Status: DC | PRN
  Administered 2018-05-16: 100 mg via INTRAVENOUS

## 2018-05-16 MED ORDER — GABAPENTIN 300 MG PO CAPS
300.0000 mg | ORAL_CAPSULE | Freq: Three times a day (TID) | ORAL | Status: DC
  Administered 2018-05-16 – 2018-05-17 (×3): 300 mg via ORAL
  Filled 2018-05-16 (×3): qty 1

## 2018-05-16 MED ORDER — HYDROMORPHONE HCL 1 MG/ML IJ SOLN
INTRAMUSCULAR | Status: AC
  Administered 2018-05-16: 0.5 mg via INTRAVENOUS
  Filled 2018-05-16: qty 1

## 2018-05-16 MED ORDER — ONDANSETRON HCL 4 MG/2ML IJ SOLN
INTRAMUSCULAR | Status: DC | PRN
  Administered 2018-05-16: 4 mg via INTRAVENOUS

## 2018-05-16 MED ORDER — HYDROMORPHONE HCL 1 MG/ML IJ SOLN
INTRAMUSCULAR | Status: AC
  Filled 2018-05-16: qty 0.5

## 2018-05-16 MED ORDER — ESTRADIOL 0.1 MG/GM VA CREA
TOPICAL_CREAM | VAGINAL | Status: AC
  Filled 2018-05-16: qty 42.5

## 2018-05-16 MED ORDER — LIDOCAINE HCL (CARDIAC) PF 100 MG/5ML IV SOSY
PREFILLED_SYRINGE | INTRAVENOUS | Status: AC
  Filled 2018-05-16: qty 5

## 2018-05-16 MED ORDER — PROPOFOL 10 MG/ML IV BOLUS
INTRAVENOUS | Status: AC
  Filled 2018-05-16: qty 20

## 2018-05-16 MED ORDER — MENTHOL 3 MG MT LOZG
1.0000 | LOZENGE | OROMUCOSAL | Status: DC | PRN
  Administered 2018-05-16: 3 mg via ORAL
  Filled 2018-05-16: qty 9

## 2018-05-16 MED ORDER — METHOCARBAMOL 500 MG PO TABS
500.0000 mg | ORAL_TABLET | Freq: Four times a day (QID) | ORAL | Status: DC
  Administered 2018-05-16 – 2018-05-17 (×4): 500 mg via ORAL
  Filled 2018-05-16 (×4): qty 1

## 2018-05-16 MED ORDER — HYDROMORPHONE HCL 1 MG/ML IJ SOLN
INTRAMUSCULAR | Status: AC
  Filled 2018-05-16: qty 1

## 2018-05-16 MED ORDER — METOCLOPRAMIDE HCL 5 MG/ML IJ SOLN: 10.0000 mg | Freq: Once | INTRAMUSCULAR | Status: DC | PRN

## 2018-05-16 MED ORDER — SCOPOLAMINE 1 MG/3DAYS TD PT72
MEDICATED_PATCH | TRANSDERMAL | Status: AC
  Filled 2018-05-16: qty 1

## 2018-05-16 MED ORDER — LIDOCAINE-EPINEPHRINE 1 %-1:100000 IJ SOLN
INTRAMUSCULAR | Status: DC | PRN
  Administered 2018-05-16: 20 mL

## 2018-05-16 MED ORDER — LIDOCAINE-EPINEPHRINE 1 %-1:100000 IJ SOLN
INTRAMUSCULAR | Status: AC
  Filled 2018-05-16: qty 1

## 2018-05-16 MED ORDER — PROPOFOL 10 MG/ML IV BOLUS
INTRAVENOUS | Status: DC | PRN
  Administered 2018-05-16: 150 mg via INTRAVENOUS
  Administered 2018-05-16: 50 mg via INTRAVENOUS

## 2018-05-16 MED ORDER — ROCURONIUM BROMIDE 100 MG/10ML IV SOLN
INTRAVENOUS | Status: AC
  Filled 2018-05-16: qty 1

## 2018-05-16 MED ORDER — FENTANYL CITRATE (PF) 100 MCG/2ML IJ SOLN
INTRAMUSCULAR | Status: DC | PRN
  Administered 2018-05-16 (×2): 100 ug via INTRAVENOUS
  Administered 2018-05-16: 50 ug via INTRAVENOUS

## 2018-05-16 MED ORDER — ONDANSETRON HCL 4 MG/2ML IJ SOLN
INTRAMUSCULAR | Status: AC
  Filled 2018-05-16: qty 2

## 2018-05-16 MED ORDER — METHOCARBAMOL 1000 MG/10ML IJ SOLN
500.0000 mg | Freq: Once | INTRAVENOUS | Status: AC
  Administered 2018-05-16: 500 mg via INTRAVENOUS
  Filled 2018-05-16: qty 5

## 2018-05-16 MED ORDER — LACTATED RINGERS IV SOLN: INTRAVENOUS | Status: DC

## 2018-05-16 MED ORDER — MIDAZOLAM HCL 2 MG/2ML IJ SOLN
INTRAMUSCULAR | Status: DC | PRN
  Administered 2018-05-16: 2 mg via INTRAVENOUS

## 2018-05-16 MED ORDER — HYDROMORPHONE HCL 1 MG/ML IJ SOLN
INTRAMUSCULAR | Status: DC | PRN
  Administered 2018-05-16: 1 mg via INTRAVENOUS

## 2018-05-16 MED ORDER — GLYCOPYRROLATE 0.2 MG/ML IJ SOLN
INTRAMUSCULAR | Status: DC | PRN
  Administered 2018-05-16: 0.1 mg via INTRAVENOUS

## 2018-05-16 MED ORDER — DEXAMETHASONE SODIUM PHOSPHATE 10 MG/ML IJ SOLN
INTRAMUSCULAR | Status: AC
  Filled 2018-05-16: qty 1

## 2018-05-16 MED ORDER — SCOPOLAMINE 1 MG/3DAYS TD PT72
1.0000 | MEDICATED_PATCH | Freq: Once | TRANSDERMAL | Status: DC
  Administered 2018-05-16: 1.5 mg via TRANSDERMAL

## 2018-05-16 MED ORDER — DEXAMETHASONE SODIUM PHOSPHATE 10 MG/ML IJ SOLN
INTRAMUSCULAR | Status: DC | PRN
  Administered 2018-05-16: 10 mg via INTRAVENOUS

## 2018-05-16 MED ORDER — ROCURONIUM BROMIDE 100 MG/10ML IV SOLN
INTRAVENOUS | Status: DC | PRN
  Administered 2018-05-16: 45 mg via INTRAVENOUS
  Administered 2018-05-16: 5 mg via INTRAVENOUS

## 2018-05-16 MED ORDER — CEFAZOLIN SODIUM-DEXTROSE 2-4 GM/100ML-% IV SOLN
INTRAVENOUS | Status: AC
  Filled 2018-05-16: qty 100

## 2018-05-16 MED ORDER — MEPERIDINE HCL 25 MG/ML IJ SOLN: 6.2500 mg | INTRAMUSCULAR | Status: DC | PRN

## 2018-05-16 MED ORDER — ALBUTEROL SULFATE (2.5 MG/3ML) 0.083% IN NEBU: 2.5000 mg | INHALATION_SOLUTION | Freq: Four times a day (QID) | RESPIRATORY_TRACT | Status: DC | PRN

## 2018-05-16 MED ORDER — LACTATED RINGERS IV SOLN
INTRAVENOUS | Status: DC
  Administered 2018-05-16 – 2018-05-17 (×2): via INTRAVENOUS

## 2018-05-16 MED ORDER — HYDROMORPHONE HCL 1 MG/ML IJ SOLN
0.2500 mg | INTRAMUSCULAR | Status: DC | PRN
  Administered 2018-05-16 (×3): 0.5 mg via INTRAVENOUS

## 2018-05-16 MED ORDER — FENTANYL CITRATE (PF) 250 MCG/5ML IJ SOLN
INTRAMUSCULAR | Status: AC
  Filled 2018-05-16: qty 5

## 2018-05-16 MED ORDER — HYDROMORPHONE HCL 1 MG/ML IJ SOLN
1.0000 mg | INTRAMUSCULAR | Status: DC | PRN
  Administered 2018-05-16 – 2018-05-17 (×3): 1 mg via INTRAVENOUS
  Filled 2018-05-16 (×3): qty 1

## 2018-05-16 SURGICAL SUPPLY — 23 items
CANISTER SUCT 3000ML PPV (MISCELLANEOUS) ×2 IMPLANT
CONT PATH 16OZ SNAP LID 3702 (MISCELLANEOUS) IMPLANT
DECANTER SPIKE VIAL GLASS SM (MISCELLANEOUS) IMPLANT
GAUZE PACKING 2X5 YD STRL (GAUZE/BANDAGES/DRESSINGS) ×2 IMPLANT
GLOVE BIOGEL PI IND STRL 6.5 (GLOVE) ×1 IMPLANT
GLOVE BIOGEL PI IND STRL 7.0 (GLOVE) ×2 IMPLANT
GLOVE BIOGEL PI INDICATOR 6.5 (GLOVE) ×1
GLOVE BIOGEL PI INDICATOR 7.0 (GLOVE) ×2
GLOVE ECLIPSE 7.0 STRL STRAW (GLOVE) ×4 IMPLANT
GOWN STRL REUS W/TWL LRG LVL3 (GOWN DISPOSABLE) ×10 IMPLANT
NEEDLE HYPO 22GX1.5 SAFETY (NEEDLE) ×2 IMPLANT
NEEDLE SPNL 18GX3.5 QUINCKE PK (NEEDLE) ×2 IMPLANT
NS IRRIG 1000ML POUR BTL (IV SOLUTION) ×2 IMPLANT
PACK VAGINAL WOMENS (CUSTOM PROCEDURE TRAY) ×2 IMPLANT
PAD OB MATERNITY 4.3X12.25 (PERSONAL CARE ITEMS) ×2 IMPLANT
SUT VIC AB 0 CT1 18XCR BRD8 (SUTURE) ×3 IMPLANT
SUT VIC AB 0 CT1 27 (SUTURE) ×2
SUT VIC AB 0 CT1 27XBRD ANBCTR (SUTURE) ×2 IMPLANT
SUT VIC AB 0 CT1 8-18 (SUTURE) ×3
SUT VICRYL 0 TIES 12 18 (SUTURE) ×2 IMPLANT
SYR 20CC LL (SYRINGE) ×2 IMPLANT
TOWEL OR 17X24 6PK STRL BLUE (TOWEL DISPOSABLE) ×4 IMPLANT
TRAY FOLEY W/BAG SLVR 14FR (SET/KITS/TRAYS/PACK) ×2 IMPLANT

## 2018-05-16 NOTE — Op Note (Signed)
Preoperative diagnosis: abnormal bleeding, pelvic pain  Postoperative diagnosis: Same  Procedure: Transvaginal hysterectomy   Surgeon: Standley Dakins. Kennon Rounds, M.D.  Assistant: Arvilla Meres, MD - An experienced assistant was required given the standard of surgical care given the complexity of the case.  This assistant was needed for exposure, dissection, suctioning, retraction, instrument exchange, and for overall help during the procedure.  Anesthesia: Esmond Harps, MD, MD  Findings: Boggy uterus. Adhesions to tubes bilaterally.  Estimated blood loss: 100 cc  Specimen: Uterus to pathology  Reason for procedure: Patient had long h/o bleeding and pelvic pain. The patient desired definitive treatment.  Risks of  hysterectomy reviewed.  Risks include but are not limited to bleeding, infection, injury to surrounding structures, including bowel, bladder and ureters, blood clots, and death.  Likelihood of success of surgery is high.   Procedure: Patient was taken to the OR where she was placed in dorsal lithotomy in [place]. She was prepped and draped in the usual sterile fashion. A timeout was performed. The patient received 2 g of Ancef prior to procedure. The patient had SCDs in place.  A speculum was placed inside the vagina. The cervix was visualized and grasped with 2 doublle-tooth tenacula. 20 cc of 1% lidocaine with epinephrine were injected paracervically. A knife was used to make a circumferential incision around the vagina. An opened sponge was used to dissect the vagina off the cervix. The posterior peritoneum was entered sharply with thjis incision. The posterior peritoneum was tagged to the vaginal cuff with a single stitch. The anterior peritoneal cavity was entered sharply with careful dissection of the bladder off the underlying cervix. A Heaney clamp was used to clamp first the left uterosacral ligament and cardinal which was then cut and Haney suture ligated with 0  Vicryl stitch, the stitch was held. Similarly the right uterosacral ligament was clamped, cut and suture ligated. Sequential bites up the broad to the uterine arteries were taken until the tubo-ovarian pedicles were encountered. The uterus was then inverted and the left utero-ovarian pedicle grasped with a Heaney clamp. The right utero-ovarian pedicle was similarly grasped with the Heaney clamp x 2. Specimen removed and suture and free ties used to secure the pedicles. Inspection of all pedicles revealed adequate hemostasis. There was some bleeding noted at the vaginal cuff on the left side. A tonsil was used to secure the bleeding followed by suture The vagina was closed with 0 Vicryl suture in a locked running fashion with care taken to incorporate the uterosacral pedicles. Excellent hemostasis was noted at the end of the case. The vaginal cuff was inspected there was minimal bleeding noted.  A Foley catheter is placed inside her bladder. Clear, yellow urine was noted. All instrument needle and lap counts were correct x 2. Patient was awakened taken to recovery room in stable condition.  [name], MD 05/16/2018, 11:46 AM

## 2018-05-16 NOTE — Interval H&P Note (Signed)
History and Physical Interval Note:  05/16/2018 10:13 AM  Sonya Berry  has presented today for surgery, with the diagnosis of Menorrhagia Anemia Pelvic Pain  The various methods of treatment have been discussed with the patient and family. After consideration of risks, benefits and other options for treatment, the patient has consented to  Procedure(s): HYSTERECTOMY VAGINAL WITH SALPINGECTOMY (Bilateral) as a surgical intervention .  The patient's history has been reviewed, patient examined, no change in status, stable for surgery.  I have reviewed the patient's chart and labs.  Questions were answered to the patient's satisfaction.     [name]

## 2018-05-16 NOTE — Transfer of Care (Signed)
Immediate Anesthesia Transfer of Care Note  Patient: Sonya Berry  Procedure(s) Performed: HYSTERECTOMY VAGINAL (N/A )  Patient Location: PACU  Anesthesia Type:General  Level of Consciousness: awake, alert  and oriented  Airway & Oxygen Therapy: Patient Spontanous Breathing and Patient connected to nasal cannula oxygen  Post-op Assessment: Report given to RN and Post -op Vital signs reviewed and stable  Post vital signs: Reviewed and stable  Last Vitals:  Vitals Value Taken Time  BP    Temp    Pulse 88 05/16/2018 12:06 PM  Resp 21 05/16/2018 12:06 PM  SpO2 98 % 05/16/2018 12:06 PM  Vitals shown include unvalidated device data.  Last Pain:  Vitals:   05/16/18 0925  TempSrc: Oral  PainSc: 7       Patients Stated Pain Goal: 2 (63/49/49 4473)  Complications: No apparent anesthesia complications

## 2018-05-16 NOTE — Progress Notes (Signed)
Patient refused vital signs check @ 2028, stated arm is sore from previous v/s checks. Educated abt importance of checking it & agreed to be checked once this shift.

## 2018-05-16 NOTE — Anesthesia Procedure Notes (Signed)
Procedure Name: Intubation Date/Time: 05/16/2018 10:46 AM Performed by: Flossie Dibble, CRNA Pre-anesthesia Checklist: Patient identified, Patient being monitored, Timeout performed, Emergency Drugs available and Suction available Patient Re-evaluated:Patient Re-evaluated prior to induction Oxygen Delivery Method: Circle System Utilized Preoxygenation: Pre-oxygenation with 100% oxygen Induction Type: IV induction Ventilation: Mask ventilation without difficulty Laryngoscope Size: Mac and 3 Grade View: Grade II Tube type: Oral Tube size: 7.0 mm Number of attempts: 1 Airway Equipment and Method: stylet Placement Confirmation: ETT inserted through vocal cords under direct vision,  positive ETCO2 and breath sounds checked- equal and bilateral Secured at: 22 cm Tube secured with: Tape Dental Injury: Teeth and Oropharynx as per pre-operative assessment

## 2018-05-16 NOTE — Anesthesia Preprocedure Evaluation (Signed)
Anesthesia Evaluation    Airway Mallampati: II  TM Distance: >3 FB Neck ROM: Full    Dental  (+) Poor Dentition   Pulmonary asthma , sleep apnea , Current Smoker,    Pulmonary exam normal breath sounds clear to auscultation       Cardiovascular hypertension, Normal cardiovascular exam Rhythm:Regular Rate:Normal     Neuro/Psych PSYCHIATRIC DISORDERS Anxiety Depression negative neurological ROS     GI/Hepatic Neg liver ROS, GERD  Medicated and Controlled,  Endo/Other  Morbid obesity  Renal/GU negative Renal ROS  negative genitourinary   Musculoskeletal Chronic lumbar back pain   Abdominal (+) + obese,   Peds  Hematology  (+) anemia ,   Anesthesia Other Findings   Reproductive/Obstetrics Menorrhagia Uterine fibroids  Hx/o endometriosis                             Anesthesia Physical Anesthesia Plan  ASA: III  Anesthesia Plan: General   Post-op Pain Management:    Induction: Intravenous  PONV Risk Score and Plan: Ondansetron, Dexamethasone, Scopolamine patch - Pre-op, Midazolam and Treatment may vary due to age or medical condition  Airway Management Planned: Oral ETT  Additional Equipment:   Intra-op Plan:   Post-operative Plan: Extubation in OR  Informed Consent: I have reviewed the patients History and Physical, chart, labs and discussed the procedure including the risks, benefits and alternatives for the proposed anesthesia with the patient or authorized representative who has indicated his/her understanding and acceptance.   Dental advisory given  Plan Discussed with: CRNA and Surgeon  Anesthesia Plan Comments:         Anesthesia Quick Evaluation

## 2018-05-16 NOTE — Anesthesia Postprocedure Evaluation (Signed)
Anesthesia Post Note  Patient: Sonya Berry  Procedure(s) Performed: HYSTERECTOMY VAGINAL (N/A )     Patient location during evaluation: PACU Anesthesia Type: General Level of consciousness: awake and alert and oriented Pain management: pain level controlled Vital Signs Assessment: post-procedure vital signs reviewed and stable Respiratory status: spontaneous breathing, nonlabored ventilation, respiratory function stable and patient connected to nasal cannula oxygen Cardiovascular status: blood pressure returned to baseline and stable Postop Assessment: no apparent nausea or vomiting Anesthetic complications: no    Last Vitals:  Vitals:   05/16/18 1300 05/16/18 1315  BP: (!) 155/70 140/71  Pulse: 95 66  Resp: (!) 23 16  Temp: 37 C   SpO2: 99% 98%    Last Pain:  Vitals:   05/16/18 1325  TempSrc:   PainSc: 7    Pain Goal: Patients Stated Pain Goal: 2 (05/16/18 0925)               Sonya Chowning A.

## 2018-05-17 ENCOUNTER — Encounter (HOSPITAL_COMMUNITY): Payer: Self-pay | Admitting: Family Medicine

## 2018-05-17 DIAGNOSIS — N920 Excessive and frequent menstruation with regular cycle: Secondary | ICD-10-CM | POA: Diagnosis not present

## 2018-05-17 LAB — CBC
HCT: 33.4 % — ABNORMAL LOW (ref 36.0–46.0)
HEMOGLOBIN: 10.2 g/dL — AB (ref 12.0–15.0)
MCH: 25 pg — ABNORMAL LOW (ref 26.0–34.0)
MCHC: 30.5 g/dL (ref 30.0–36.0)
MCV: 81.9 fL (ref 80.0–100.0)
NRBC: 0 % (ref 0.0–0.2)
Platelets: 402 10*3/uL — ABNORMAL HIGH (ref 150–400)
RBC: 4.08 MIL/uL (ref 3.87–5.11)
RDW: 16.2 % — ABNORMAL HIGH (ref 11.5–15.5)
WBC: 20 10*3/uL — ABNORMAL HIGH (ref 4.0–10.5)

## 2018-05-17 MED ORDER — IBUPROFEN 800 MG PO TABS: 800.0000 mg | ORAL_TABLET | Freq: Three times a day (TID) | ORAL | 0 refills | Status: DC | PRN

## 2018-05-17 MED ORDER — OXYCODONE HCL 5 MG PO TABS: 5.0000 mg | ORAL_TABLET | ORAL | 0 refills | Status: DC | PRN

## 2018-05-17 NOTE — Discharge Summary (Signed)
Physician Discharge Summary  Patient ID: Quanesha Klimaszewski Kunda MRN: [id number] DOB/AGE: [id number] y.o.  Admit date: 05/16/2018 Discharge date:   Admission Diagnoses:  Principal Problem:   Menorrhagia Active Problems:   Anemia due to chronic blood loss   Pelvic pain   Menorrhagia with regular cycle   Status post hysterectomy   Discharge Diagnoses:  Same  Past Medical History:  Diagnosis Date  . Anemia   . Anxiety   . Asthma    "seasona" when allergies are bad  . Chronic back pain   . Fibroid   . GERD (gastroesophageal reflux disease)   . Poor dental hygiene   . Sleep apnea    does not use or have CPAP  . SVD (spontaneous vaginal delivery)    x 2  . Vaginal Pap smear, abnormal    not had pap smear since 2012    Surgeries: Procedure(s): HYSTERECTOMY VAGINAL on 05/16/2018   Consultants: None  Discharged Condition: Improved  Hospital Course: Khylei Wilms Stanko is an 47 y.o. female [phone] who was admitted 05/16/2018 with a chief complaint of No chief complaint on file. , and found to have a diagnosis of Menorrhagia.  They were brought to the operating room on 05/16/2018 and underwent the above named procedures.    They were given perioperative antibiotics:  Anti-infectives (From admission, onward)   Start     Dose/Rate Route Frequency Ordered Stop   05/16/18 0934  ceFAZolin (ANCEF) 2-4 GM/100ML-% IVPB    Note to Pharmacy:  [facility]   : cabinet override      05/16/18 0934 05/16/18 1026   05/16/18 0600  ceFAZolin (ANCEF) IVPB 2g/100 mL premix     2 g 200 mL/hr over 30 Minutes Intravenous On call to O.R. 05/15/18 2235 05/16/18 1043    .  They were given sequential compression devices, early ambulation for DVT prophylaxis. She was ambulating, voiding, tolerating po.  They benefited maximally from their hospital stay and there were no complications.    Recent vital signs:  Vitals:   05/16/18 2214 05/16/18 2234  Pulse: (!) 113   Resp: 18   Temp: 97.7 F  (36.5 C)   SpO2: 90% 95%  Discharge Exam: Physical Examination: General appearance - alert, well appearing, and in no distress Chest - normal effort Abdomen - soft, appropriately tender Extremities - peripheral pulses normal, no pedal edema, no clubbing or cyanosis, Homan's sign negative bilaterally   Recent laboratory studies:  Results for orders placed or performed during the hospital encounter of 05/16/18  Pregnancy, urine  Result Value Ref Range   Preg Test, Ur NEGATIVE NEGATIVE  CBC  Result Value Ref Range   WBC 20.0 (H) 4.0 - 10.5 K/uL   RBC 4.08 3.87 - 5.11 MIL/uL   Hemoglobin 10.2 (L) 12.0 - 15.0 g/dL   HCT 33.4 (L) 36.0 - 46.0 %   MCV 81.9 80.0 - 100.0 fL   MCH 25.0 (L) 26.0 - 34.0 pg   MCHC 30.5 30.0 - 36.0 g/dL   RDW 16.2 (H) 11.5 - 15.5 %   Platelets 402 (H) 150 - 400 K/uL   nRBC 0.0 0.0 - 0.2 %    Discharge Medications:   Allergies as of 05/17/2018      Reactions   Gelatin Anaphylaxis   Pantoprazole Anaphylaxis   Celebrex [celecoxib] Other (See Comments)   Joints freeze up   Diclofenac Sodium Other (See Comments)   REACTION: jittery, chest pains; anxiety; unable to void  Escitalopram Oxalate Other (See Comments)   REACTION: jittery; anuria; hyperirritability   Levaquin [levofloxacin In D5w] Hives   Rofecoxib Other (See Comments)   Joints freeze up   Ultram [tramadol] Nausea And Vomiting      Medication List    TAKE these medications   albuterol 108 (90 Base) MCG/ACT inhaler Commonly known as:  PROVENTIL HFA;VENTOLIN HFA Inhale 1-2 puffs into the lungs every 6 (six) hours as needed for wheezing or shortness of breath.   cyclobenzaprine 10 MG tablet Commonly known as:  FLEXERIL Take 10 mg by mouth 3 (three) times daily as needed for muscle spasms.   EPINEPHrine 0.3 mg/0.3 mL Soaj injection Commonly known as:  EPI-PEN Inject 0.3 mLs (0.3 mg total) into the muscle once.   gabapentin 300 MG capsule Commonly known as:  NEURONTIN Take 300 mg by  mouth 3 (three) times daily.   ibuprofen 800 MG tablet Commonly known as:  ADVIL,MOTRIN Take 1 tablet (800 mg total) by mouth every 8 (eight) hours as needed.   methocarbamol 500 MG tablet Commonly known as:  ROBAXIN Take 1 tablet (500 mg total) by mouth 4 (four) times daily.   omeprazole 20 MG capsule Commonly known as:  PRILOSEC Take 20 mg by mouth daily.   oxyCODONE 5 MG immediate release tablet Commonly known as:  Oxy IR/ROXICODONE Take 1 tablet (5 mg total) by mouth every 4 (four) hours as needed for severe pain.   oxyCODONE-acetaminophen 5-325 MG tablet Commonly known as:  PERCOCET/ROXICET Take 1 tablet by mouth every 4 (four) hours as needed for severe pain.      Has home oxycodone, to resume once postoperative rx is complete.  Diagnostic Studies: US Pelvic Complete With Transvaginal  Result Date: 04/19/2018 CLINICAL DATA:  Menorrhagia with regular cycles EXAM: TRANSABDOMINAL AND TRANSVAGINAL ULTRASOUND OF PELVIS TECHNIQUE: Both transabdominal and transvaginal ultrasound examinations of the pelvis were performed. Transabdominal technique was performed for global imaging of the pelvis including uterus, ovaries, adnexal regions, and pelvic cul-de-sac. It was necessary to proceed with endovaginal exam following the transabdominal exam to visualize the uterus, endometrium, ovaries and adnexa. COMPARISON:  07/02/2014 FINDINGS: Uterus Measurements: 8.6 x 5.4 x 6.2 cm. Diffusely heterogeneous echotexture throughout the uterus. C-section scar noted anteriorly. No focal measurable fibroid Endometrium Thickness: 7 mm in thickness, somewhat difficult to visualize due to heterogeneity throughout the uterus. No focal abnormality visualized. Right ovary Measurements: 3.4 x 1.9 x 1.8 cm. Normal appearance/no adnexal mass. Left ovary Measurements: 4.5 x 2.5 x 2.6 cm. Two small cysts or follicles in the left ovary appear simple, the largest 2.6 cm. No adnexal mass. Other findings No abnormal free  fluid. IMPRESSION: Diffuse heterogeneity throughout the uterus can be seen with adenomyosis. No focal fibroid. Small simple appearing cysts or follicles in the left ovary. No acute findings. Electronically Signed   By: Rolm Baptise M.D.   On: 04/19/2018 10:16    Disposition: Discharge disposition: 01-Home or Self Care       Discharge Instructions    Call MD for:  persistant nausea and vomiting   Complete by:  As directed    Call MD for:  redness, tenderness, or signs of infection (pain, swelling, redness, odor or green/yellow discharge around incision site)   Complete by:  As directed    Call MD for:  severe uncontrolled pain   Complete by:  As directed    Call MD for:  temperature >100.4   Complete by:  As directed    Diet -  low sodium heart healthy   Complete by:  As directed    Increase activity slowly   Complete by:  As directed       [place] [facility] [place] In 2 weeks.   Specialty:  Obstetrics and Gynecology Why:  postop check, they will call you with an appointment Contact information: [place] [phone]           Signed: [name] 05/17/2018, 8:18 AM

## 2018-05-17 NOTE — Discharge Instructions (Signed)
Do not combine usual dose of Oxycodone with new prescription of Oxycodone for postoperative pain management. Once your need for postoperative pain is complete, may resume usual pain meds.  Your blood pressures have been up persistently while in the hospital. Please inform your primary care provider, that you may need blood pressure meds to help control your blood pressure. If nothing else, see below, about DASH diet as a way to control blood pressure.  Vaginal Hysterectomy, Care After Refer to this sheet in the next few weeks. These instructions provide you with information about caring for yourself after your procedure. Your health care provider may also give you more specific instructions. Your treatment has been planned according to current medical practices, but problems sometimes occur. Call your health care provider if you have any problems or questions after your procedure. What can I expect after the procedure? After the procedure, it is common to have:  Pain.  Soreness and numbness in your incision areas.  Vaginal bleeding and discharge.  Constipation.  Temporary problems emptying the bladder.  Feelings of sadness or other emotions.  Follow these instructions at home: Medicines  Take over-the-counter and prescription medicines only as told by your health care provider.  If you were prescribed an antibiotic medicine, take it as told by your health care provider. Do not stop taking the antibiotic even if you start to feel better.  Do not drive or operate heavy machinery while taking prescription pain medicine. Activity  Return to your normal activities as told by your health care provider. Ask your health care provider what activities are safe for you.  Get regular exercise as told by your health care provider. You may be told to take short walks every day and go farther each time.  Do not lift anything that is heavier than 10 lb (4.5 kg). General instructions   Do not  put anything in your vagina for 6 weeks after your surgery or as told by your health care provider. This includes tampons and douches.  Do not have sex until your health care provider says you can.  Do not take baths, swim, or use a hot tub until your health care provider approves.  Drink enough fluid to keep your urine clear or pale yellow.  Do not drive for 24 hours if you were given a sedative.  Keep all follow-up visits as told by your health care provider. This is important. Contact a health care provider if:  Your pain medicine is not helping.  You have a fever.  You have redness, swelling, or pain at your incision site.  You have blood, pus, or a bad-smelling discharge from your vagina.  You continue to have difficulty urinating. Get help right away if:  You have severe abdominal or back pain.  You have heavy bleeding from your vagina.  You have chest pain or shortness of breath. This information is not intended to replace advice given to you by your health care provider. Make sure you discuss any questions you have with your health care provider. Document Released: 10/21/2015 Document Revised: 12/05/2015 Document Reviewed: 07/14/2015 Elsevier Interactive Patient Education  2018 [place] Eating Plan DASH stands for "Dietary Approaches to Stop Hypertension." The DASH eating plan is a healthy eating plan that has been shown to reduce high blood pressure (hypertension). It may also reduce your risk for type 2 diabetes, heart disease, and stroke. The DASH eating plan may also help with weight loss. What are tips for following  this plan? General guidelines  Avoid eating more than 2,300 mg (milligrams) of salt (sodium) a day. If you have hypertension, you may need to reduce your sodium intake to 1,500 mg a day.  Limit alcohol intake to no more than 1 drink a day for nonpregnant women and 2 drinks a day for men. One drink equals 12 oz of beer, 5 oz of wine, or 1  oz of hard liquor.  Work with your health care provider to maintain a healthy body weight or to lose weight. Ask what an ideal weight is for you.  Get at least 30 minutes of exercise that causes your heart to beat faster (aerobic exercise) most days of the week. Activities may include walking, swimming, or biking.  Work with your health care provider or diet and nutrition specialist (dietitian) to adjust your eating plan to your individual calorie needs. Reading food labels  Check food labels for the amount of sodium per serving. Choose foods with less than 5 percent of the Daily Value of sodium. Generally, foods with less than 300 mg of sodium per serving fit into this eating plan.  To find whole grains, look for the word "whole" as the first word in the ingredient list. Shopping  Buy products labeled as "low-sodium" or "no salt added."  Buy fresh foods. Avoid canned foods and premade or frozen meals. Cooking  Avoid adding salt when cooking. Use salt-free seasonings or herbs instead of table salt or sea salt. Check with your health care provider or pharmacist before using salt substitutes.  Do not fry foods. Cook foods using healthy methods such as baking, boiling, grilling, and broiling instead.  Cook with heart-healthy oils, such as olive, canola, soybean, or sunflower oil. Meal planning   Eat a balanced diet that includes: ? 5 or more servings of fruits and vegetables each day. At each meal, try to fill half of your plate with fruits and vegetables. ? Up to 6-8 servings of whole grains each day. ? Less than 6 oz of lean meat, poultry, or fish each day. A 3-oz serving of meat is about the same size as a deck of cards. One egg equals 1 oz. ? 2 servings of low-fat dairy each day. ? A serving of nuts, seeds, or beans 5 times each week. ? Heart-healthy fats. Healthy fats called Omega-3 fatty acids are found in foods such as flaxseeds and coldwater fish, like sardines, salmon, and  mackerel.  Limit how much you eat of the following: ? Canned or prepackaged foods. ? Food that is high in trans fat, such as fried foods. ? Food that is high in saturated fat, such as fatty meat. ? Sweets, desserts, sugary drinks, and other foods with added sugar. ? Full-fat dairy products.  Do not salt foods before eating.  Try to eat at least 2 vegetarian meals each week.  Eat more home-cooked food and less restaurant, buffet, and fast food.  When eating at a restaurant, ask that your food be prepared with less salt or no salt, if possible. What foods are recommended? The items listed may not be a complete list. Talk with your dietitian about what dietary choices are best for you. Grains Whole-grain or whole-wheat bread. Whole-grain or whole-wheat pasta. Brown rice. [place]row. Bulgur. Whole-grain and low-sodium cereals. Pita bread. Low-fat, low-sodium crackers. Whole-wheat flour tortillas. Vegetables Fresh or frozen vegetables (raw, steamed, roasted, or grilled). Low-sodium or reduced-sodium tomato and vegetable juice. Low-sodium or reduced-sodium tomato sauce and tomato paste. Low-sodium  or reduced-sodium canned vegetables. Fruits All fresh, dried, or frozen fruit. Canned fruit in natural juice (without added sugar). Meat and other protein foods Skinless chicken or [place]. Ground chicken or [place]. Pork with fat trimmed off. Fish and seafood. Egg whites. Dried beans, peas, or lentils. Unsalted nuts, nut butters, and seeds. Unsalted canned beans. Lean cuts of beef with fat trimmed off. Low-sodium, lean deli meat. Dairy Low-fat (1%) or fat-free (skim) milk. Fat-free, low-fat, or reduced-fat cheeses. Nonfat, low-sodium ricotta or cottage cheese. Low-fat or nonfat yogurt. Low-fat, low-sodium cheese. Fats and oils Soft margarine without trans fats. Vegetable oil. Low-fat, reduced-fat, or light mayonnaise and salad dressings (reduced-sodium). Canola, safflower, olive, soybean, and  sunflower oils. Avocado. Seasoning and other foods Herbs. Spices. Seasoning mixes without salt. Unsalted popcorn and pretzels. Fat-free sweets. What foods are not recommended? The items listed may not be a complete list. Talk with your dietitian about what dietary choices are best for you. Grains Baked goods made with fat, such as croissants, muffins, or some breads. Dry pasta or rice meal packs. Vegetables Creamed or fried vegetables. Vegetables in a cheese sauce. Regular canned vegetables (not low-sodium or reduced-sodium). Regular canned tomato sauce and paste (not low-sodium or reduced-sodium). Regular tomato and vegetable juice (not low-sodium or reduced-sodium). Angie Fava. Olives. Fruits Canned fruit in a light or heavy syrup. Fried fruit. Fruit in cream or butter sauce. Meat and other protein foods Fatty cuts of meat. Ribs. Fried meat. Berniece Salines. Sausage. Bologna and other processed lunch meats. Salami. Fatback. Hotdogs. Bratwurst. Salted nuts and seeds. Canned beans with added salt. Canned or smoked fish. Whole eggs or egg yolks. Chicken or [place] with skin. Dairy Whole or 2% milk, cream, and half-and-half. Whole or full-fat cream cheese. Whole-fat or sweetened yogurt. Full-fat cheese. Nondairy creamers. Whipped toppings. Processed cheese and cheese spreads. Fats and oils Butter. Stick margarine. Lard. Shortening. Ghee. Bacon fat. Tropical oils, such as coconut, palm kernel, or palm oil. Seasoning and other foods Salted popcorn and pretzels. Onion salt, garlic salt, seasoned salt, table salt, and sea salt. Worcestershire sauce. Tartar sauce. Barbecue sauce. Teriyaki sauce. Soy sauce, including reduced-sodium. Steak sauce. Canned and packaged gravies. Fish sauce. Oyster sauce. Cocktail sauce. Horseradish that you find on the shelf. Ketchup. Mustard. Meat flavorings and tenderizers. Bouillon cubes. Hot sauce and Tabasco sauce. Premade or packaged marinades. Premade or packaged taco seasonings.  Relishes. Regular salad dressings. Where to find more information:  National Heart, Lung, and [place]: https://wilson-eaton.com/  American Heart Association: www.heart.org Summary  The DASH eating plan is a healthy eating plan that has been shown to reduce high blood pressure (hypertension). It may also reduce your risk for type 2 diabetes, heart disease, and stroke.  With the DASH eating plan, you should limit salt (sodium) intake to 2,300 mg a day. If you have hypertension, you may need to reduce your sodium intake to 1,500 mg a day.  When on the DASH eating plan, aim to eat more fresh fruits and vegetables, whole grains, lean proteins, low-fat dairy, and heart-healthy fats.  Work with your health care provider or diet and nutrition specialist (dietitian) to adjust your eating plan to your individual calorie needs. This information is not intended to replace advice given to you by your health care provider. Make sure you discuss any questions you have with your health care provider. Document Released: 06/18/2011 Document Revised: 06/22/2016 Document Reviewed: 06/22/2016 Elsevier Interactive Patient Education  [employer].

## 2018-05-17 NOTE — Addendum Note (Signed)
Addendum  created 05/17/18 0853 by Georgeanne Nim, CRNA   Sign clinical note

## 2018-05-17 NOTE — Plan of Care (Signed)
  Problem: Elimination: Goal: Will not experience complications related to urinary retention Outcome: Progressing Note:  Voiding without difficulty   Problem: Pain Managment: Goal: General experience of comfort will improve Outcome: Not Progressing

## 2018-05-17 NOTE — Anesthesia Postprocedure Evaluation (Signed)
Anesthesia Post Note  Patient: Sonya Berry  Procedure(s) Performed: HYSTERECTOMY VAGINAL (N/A )     Patient location during evaluation: [facility] Anesthesia Type: General Level of consciousness: awake Pain management: pain level controlled Vital Signs Assessment: post-procedure vital signs reviewed and stable Respiratory status: spontaneous breathing Cardiovascular status: stable Postop Assessment: adequate PO intake and no apparent nausea or vomiting Anesthetic complications: no    Last Vitals:  Vitals:   05/16/18 2234 05/17/18 0825  BP:  (!) 156/77  Pulse:  97  Resp:  20  Temp:  37 C  SpO2: 95% 100%    Last Pain:  Vitals:   05/17/18 0825  TempSrc: Oral  PainSc:    Pain Goal: Patients Stated Pain Goal: 4 (05/16/18 1825)               [name]

## 2018-05-18 ENCOUNTER — Other Ambulatory Visit: Payer: Self-pay | Admitting: Family Medicine

## 2018-05-18 MED ORDER — OXYCODONE HCL 5 MG PO TABS: 10.0000 mg | ORAL_TABLET | ORAL | 0 refills | Status: AC | PRN

## 2018-05-18 NOTE — Progress Notes (Signed)
I called pharmacy and we agreed to higher dose for 5-7 days for acute pain related to surgery. New Rx sent in--but previous one has not been filled.  See below and please advise.    Caryl Pina  ----- Message -----  From: Karolee Ohs Mcleroy  Sent: 05/17/2018  6:49 PM EST  To: [facility]  Subject: RE: Non-Urgent Medical Question           I just talked to the pharmacist and she said Dr Kennon Rounds nor the nurse called to verify that Dr Kennon Rounds knows that I am already prescribed Percocet 5/325 monthly and that's why they haven't filled it is because she has to have confirmation verbally from someone that Dr Kennon Rounds knows. If you could please call and verify that would be what's needed.  [phone] [facility]  ----- Message -----  From: Nurse Thersa Salt  Sent: 05/17/2018 6:22 PM EST  To: Karolee Ohs Hitz  Subject: RE: Non-Urgent Medical Question  Melysa,  She approved both today. So you should now be able to go by your pharmacy to pick up the medications.     Caryl Pina RN    ----- Message -----   From: Karolee Ohs Juday   Sent: 05/17/2018 5:56 PM EST    To: [name], MD  Subject: RE: Non-Urgent Medical Question    [facility] without Dr Blane Ohara approval. Nurse Gerald Stabs was supposed to make sure Dr Kennon Rounds knew this morning because I get Percocet 5/325 from pain center and [facility]'t understand why she prescribed the oxycodone. So I haven't been able to get it. And my pain Dr. Rockey Situ me not to take my Percocet while under Dr pratts care. So if she could approve the prescription I could get it tomorrow. The ibuprofen is only helping a little.  ----- Message -----  From: Nurse Bennetta Laos E  Sent: 05/17/2018 5:41 PM EST  To: Karolee Ohs Laurie  Subject: RE: Non-Urgent Medical Question  Dell Ponto,,  Both Ibuprofen and Oxycodone Immediate Release [facility] [place] has confirmed that they received the prescription. I hope  this helps.     Caryl Pina RN    ----- Message -----   From: Karolee Ohs Skidgel   Sent: 05/17/2018 5:32 PM EST    To: [name], MD  Subject: RE: Non-Urgent Medical Question    Phebe Colla. I am glad things went well. I'll see you on the 20th. Oh Did you get the medicine issue cleared up with [facility]? They said they hadn't heard anything and maybe I should check with you.   ----- Message -----  From: [name], MD  Sent: 05/17/2018 4:36 PM EST  To: Karolee Ohs Camper  Subject: RE: Non-Urgent Medical Question  I did remove your cervix. It comes out with the uterus. It means no more pap smears! Phebe Colla!    ----- Message -----   From: Karolee Ohs Wedin   Sent: 05/17/2018 3:00 PM EST    To: [name], MD  Subject: Non-Urgent Medical Question    Hi dr Kennon Rounds I have a question. Did you remove my cervix too? I didn't think to ask sooner but I'm not sure what does or doesn't stay. Thanks

## 2018-06-01 ENCOUNTER — Ambulatory Visit: Payer: Medicaid Other | Admitting: Family Medicine

## 2018-06-15 ENCOUNTER — Ambulatory Visit (INDEPENDENT_AMBULATORY_CARE_PROVIDER_SITE_OTHER): Payer: Medicaid Other | Admitting: Family Medicine

## 2018-06-15 ENCOUNTER — Encounter: Payer: Self-pay | Admitting: Family Medicine

## 2018-06-15 VITALS — BP 156/91 | HR 113 | Ht 63.0 in | Wt 236.0 lb

## 2018-06-15 DIAGNOSIS — Z9889 Other specified postprocedural states: Secondary | ICD-10-CM

## 2018-06-15 DIAGNOSIS — Z09 Encounter for follow-up examination after completed treatment for conditions other than malignant neoplasm: Secondary | ICD-10-CM

## 2018-06-15 NOTE — Progress Notes (Signed)
   Subjective:    Patient ID: Sonya Berry is a 47 y.o. female presenting with Follow-up  on 06/15/2018  HPI: S/p TVH on 05/16/18. Having regular BM's but is having some constipation. Normal voiding. Reports some right sided pain which has been present x 3 wks. Denies fever, chills,  Review of Systems  Constitutional: Negative for chills and fever.  Respiratory: Negative for shortness of breath.   Cardiovascular: Negative for chest pain.  Gastrointestinal: Negative for abdominal pain, nausea and vomiting.  Genitourinary: Negative for dysuria.  Skin: Negative for rash.      Objective:    BP (!) 156/91 (BP Location: Left Arm)   Pulse (!) 113   Ht 5\' 3"  (1.6 m)   Wt 136 lb 1.6 oz (61.7 kg)   BMI 24.11 kg/m  Physical Exam  Constitutional: She is oriented to person, place, and time. She appears well-developed and well-nourished. No distress.  HENT:  Head: Normocephalic and atraumatic.  Eyes: No scleral icterus.  Neck: Neck supple.  Cardiovascular: Normal rate.  Pulmonary/Chest: Effort normal.  Abdominal: Soft.  Genitourinary: Vagina normal.  Genitourinary Comments: Suture present, cuff intact, no mass, minimal tenderness  Neurological: She is alert and oriented to person, place, and time.  Skin: Skin is warm and dry.  Psychiatric: She has a normal mood and affect.        Assessment & Plan:  Postop check - Doing well--no issues    Return in about 4 weeks (around 07/13/2018).  [name] 06/15/2018 1:34 PM

## 2018-06-15 NOTE — Progress Notes (Signed)
Pt states some pain on right side for the 3 weeks.

## 2018-07-14 ENCOUNTER — Encounter: Payer: Self-pay | Admitting: Family Medicine

## 2018-07-14 ENCOUNTER — Ambulatory Visit (INDEPENDENT_AMBULATORY_CARE_PROVIDER_SITE_OTHER): Payer: Medicaid Other | Admitting: Family Medicine

## 2018-07-14 VITALS — BP 183/83 | HR 116 | Ht 64.0 in | Wt 232.9 lb

## 2018-07-14 DIAGNOSIS — Z09 Encounter for follow-up examination after completed treatment for conditions other than malignant neoplasm: Secondary | ICD-10-CM

## 2018-07-14 DIAGNOSIS — Z90710 Acquired absence of both cervix and uterus: Secondary | ICD-10-CM | POA: Diagnosis not present

## 2018-07-14 DIAGNOSIS — I10 Essential (primary) hypertension: Secondary | ICD-10-CM | POA: Diagnosis present

## 2018-07-14 DIAGNOSIS — T7840XD Allergy, unspecified, subsequent encounter: Secondary | ICD-10-CM

## 2018-07-14 MED ORDER — EPINEPHRINE 0.3 MG/0.3ML IJ SOAJ: 0.3000 mg | Freq: Once | INTRAMUSCULAR | 1 refills | Status: AC

## 2018-07-14 MED ORDER — HYDROCHLOROTHIAZIDE 25 MG PO TABS: 25.0000 mg | ORAL_TABLET | Freq: Every day | ORAL | 3 refills | Status: DC

## 2018-07-14 NOTE — Assessment & Plan Note (Signed)
Given how high this is, will begin HCTZ

## 2018-07-14 NOTE — Progress Notes (Signed)
   Subjective:    Patient ID: Sonya Berry is a 48 y.o. female presenting with Follow-up  on 07/14/2018  HPI: Here for f/u [facility]. She is doing well, but having some anxiety. BP is high today, wants meds. Still smoking. No vaginal issues.  Review of Systems  Constitutional: Negative for chills and fever.  Respiratory: Negative for shortness of breath.   Cardiovascular: Negative for chest pain.  Gastrointestinal: Negative for abdominal pain, nausea and vomiting.  Genitourinary: Negative for dysuria.  Skin: Negative for rash.      Objective:    BP (!) 183/83   Pulse (!) 116   Ht 5\' 4"  (1.626 m)   Wt 232 lb 14.4 oz (105.6 kg)   LMP 04/12/2018 (Approximate)   BMI 39.98 kg/m  Physical Exam Constitutional:      General: She is not in acute distress.    Appearance: She is well-developed.  HENT:     Head: Normocephalic and atraumatic.  Eyes:     General: No scleral icterus. Neck:     Musculoskeletal: Neck supple.  Cardiovascular:     Rate and Rhythm: Normal rate.  Pulmonary:     Effort: Pulmonary effort is normal.  Abdominal:     Palpations: Abdomen is soft.  Genitourinary:    Comments: Cuff intact, minimal remaining suture Skin:    General: Skin is warm and dry.  Neurological:     Mental Status: She is alert and oriented to person, place, and time.         Assessment & Plan:   Problem List Items Addressed This Visit      Unprioritized   HYPERTENSION, BENIGN - Primary    Given how high this is, will begin HCTZ      Relevant Medications   hydrochlorothiazide (HYDRODIURIL) 25 MG tablet   EPINEPHrine (EPIPEN 2-PAK) 0.3 mg/0.3 mL IJ SOAJ injection   Status post hysterectomy    Doing well, resume full activity       Other Visit Diagnoses    Postop check       Allergic state, subsequent encounter       Relevant Medications   EPINEPHrine (EPIPEN 2-PAK) 0.3 mg/0.3 mL IJ SOAJ injection      Total face-to-face time with patient: 15 minutes. Over 50% of  encounter was spent on counseling and coordination of care. Return if symptoms worsen or fail to improve.  [name] 07/14/2018 10:04 AM

## 2018-07-14 NOTE — Assessment & Plan Note (Signed)
Doing well, resume full activity

## 2018-07-14 NOTE — Patient Instructions (Signed)
DASH Eating Plan  DASH stands for "Dietary Approaches to Stop Hypertension." The DASH eating plan is a healthy eating plan that has been shown to reduce high blood pressure (hypertension). It may also reduce your risk for type 2 diabetes, heart disease, and stroke. The DASH eating plan may also help with weight loss.  What are tips for following this plan?    General guidelines   Avoid eating more than 2,300 mg (milligrams) of salt (sodium) a day. If you have hypertension, you may need to reduce your sodium intake to 1,500 mg a day.   Limit alcohol intake to no more than 1 drink a day for nonpregnant women and 2 drinks a day for men. One drink equals 12 oz of beer, 5 oz of wine, or 1 oz of hard liquor.   Work with your health care provider to maintain a healthy body weight or to lose weight. Ask what an ideal weight is for you.   Get at least 30 minutes of exercise that causes your heart to beat faster (aerobic exercise) most days of the week. Activities may include walking, swimming, or biking.   Work with your health care provider or diet and nutrition specialist (dietitian) to adjust your eating plan to your individual calorie needs.  Reading food labels     Check food labels for the amount of sodium per serving. Choose foods with less than 5 percent of the Daily Value of sodium. Generally, foods with less than 300 mg of sodium per serving fit into this eating plan.   To find whole grains, look for the word "whole" as the first word in the ingredient list.  Shopping   Buy products labeled as "low-sodium" or "no salt added."   Buy fresh foods. Avoid canned foods and premade or frozen meals.  Cooking   Avoid adding salt when cooking. Use salt-free seasonings or herbs instead of table salt or sea salt. Check with your health care provider or pharmacist before using salt substitutes.   Do not fry foods. Cook foods using healthy methods such as baking, boiling, grilling, and broiling instead.   Cook with  heart-healthy oils, such as olive, canola, soybean, or sunflower oil.  Meal planning   Eat a balanced diet that includes:  ? 5 or more servings of fruits and vegetables each day. At each meal, try to fill half of your plate with fruits and vegetables.  ? Up to 6-8 servings of whole grains each day.  ? Less than 6 oz of lean meat, poultry, or fish each day. A 3-oz serving of meat is about the same size as a deck of cards. One egg equals 1 oz.  ? 2 servings of low-fat dairy each day.  ? A serving of nuts, seeds, or beans 5 times each week.  ? Heart-healthy fats. Healthy fats called Omega-3 fatty acids are found in foods such as flaxseeds and coldwater fish, like sardines, salmon, and mackerel.   Limit how much you eat of the following:  ? Canned or prepackaged foods.  ? Food that is high in trans fat, such as fried foods.  ? Food that is high in saturated fat, such as fatty meat.  ? Sweets, desserts, sugary drinks, and other foods with added sugar.  ? Full-fat dairy products.   Do not salt foods before eating.   Try to eat at least 2 vegetarian meals each week.   Eat more home-cooked food and less restaurant, buffet, and fast food.     When eating at a restaurant, ask that your food be prepared with less salt or no salt, if possible.  What foods are recommended?  The items listed may not be a complete list. Talk with your dietitian about what dietary choices are best for you.  Grains  Whole-grain or whole-wheat bread. Whole-grain or whole-wheat pasta. Brown rice. Oatmeal. Quinoa. Bulgur. Whole-grain and low-sodium cereals. Pita bread. Low-fat, low-sodium crackers. Whole-wheat flour tortillas.  Vegetables  Fresh or frozen vegetables (raw, steamed, roasted, or grilled). Low-sodium or reduced-sodium tomato and vegetable juice. Low-sodium or reduced-sodium tomato sauce and tomato paste. Low-sodium or reduced-sodium canned vegetables.  Fruits  All fresh, dried, or frozen fruit. Canned fruit in natural juice (without  added sugar).  Meat and other protein foods  Skinless chicken or turkey. Ground chicken or turkey. Pork with fat trimmed off. Fish and seafood. Egg whites. Dried beans, peas, or lentils. Unsalted nuts, nut butters, and seeds. Unsalted canned beans. Lean cuts of beef with fat trimmed off. Low-sodium, lean deli meat.  Dairy  Low-fat (1%) or fat-free (skim) milk. Fat-free, low-fat, or reduced-fat cheeses. Nonfat, low-sodium ricotta or cottage cheese. Low-fat or nonfat yogurt. Low-fat, low-sodium cheese.  Fats and oils  Soft margarine without trans fats. Vegetable oil. Low-fat, reduced-fat, or light mayonnaise and salad dressings (reduced-sodium). Canola, safflower, olive, soybean, and sunflower oils. Avocado.  Seasoning and other foods  Herbs. Spices. Seasoning mixes without salt. Unsalted popcorn and pretzels. Fat-free sweets.  What foods are not recommended?  The items listed may not be a complete list. Talk with your dietitian about what dietary choices are best for you.  Grains  Baked goods made with fat, such as croissants, muffins, or some breads. Dry pasta or rice meal packs.  Vegetables  Creamed or fried vegetables. Vegetables in a cheese sauce. Regular canned vegetables (not low-sodium or reduced-sodium). Regular canned tomato sauce and paste (not low-sodium or reduced-sodium). Regular tomato and vegetable juice (not low-sodium or reduced-sodium). Pickles. Olives.  Fruits  Canned fruit in a light or heavy syrup. Fried fruit. Fruit in cream or butter sauce.  Meat and other protein foods  Fatty cuts of meat. Ribs. Fried meat. Bacon. Sausage. Bologna and other processed lunch meats. Salami. Fatback. Hotdogs. Bratwurst. Salted nuts and seeds. Canned beans with added salt. Canned or smoked fish. Whole eggs or egg yolks. Chicken or turkey with skin.  Dairy  Whole or 2% milk, cream, and half-and-half. Whole or full-fat cream cheese. Whole-fat or sweetened yogurt. Full-fat cheese. Nondairy creamers. Whipped toppings.  Processed cheese and cheese spreads.  Fats and oils  Butter. Stick margarine. Lard. Shortening. Ghee. Bacon fat. Tropical oils, such as coconut, palm kernel, or palm oil.  Seasoning and other foods  Salted popcorn and pretzels. Onion salt, garlic salt, seasoned salt, table salt, and sea salt. Worcestershire sauce. Tartar sauce. Barbecue sauce. Teriyaki sauce. Soy sauce, including reduced-sodium. Steak sauce. Canned and packaged gravies. Fish sauce. Oyster sauce. Cocktail sauce. Horseradish that you find on the shelf. Ketchup. Mustard. Meat flavorings and tenderizers. Bouillon cubes. Hot sauce and Tabasco sauce. Premade or packaged marinades. Premade or packaged taco seasonings. Relishes. Regular salad dressings.  Where to find more information:   National Heart, Lung, and Blood Institute: www.nhlbi.nih.gov   American Heart Association: www.heart.org  Summary   The DASH eating plan is a healthy eating plan that has been shown to reduce high blood pressure (hypertension). It may also reduce your risk for type 2 diabetes, heart disease, and stroke.   With the   DASH eating plan, you should limit salt (sodium) intake to 2,300 mg a day. If you have hypertension, you may need to reduce your sodium intake to 1,500 mg a day.   When on the DASH eating plan, aim to eat more fresh fruits and vegetables, whole grains, lean proteins, low-fat dairy, and heart-healthy fats.   Work with your health care provider or diet and nutrition specialist (dietitian) to adjust your eating plan to your individual calorie needs.  This information is not intended to replace advice given to you by your health care provider. Make sure you discuss any questions you have with your health care provider.  Document Released: 06/18/2011 Document Revised: 06/22/2016 Document Reviewed: 06/22/2016  Elsevier Interactive Patient Education  2019 Elsevier Inc.

## 2018-08-04 DIAGNOSIS — K921 Melena: Secondary | ICD-10-CM

## 2018-08-12 ENCOUNTER — Encounter: Payer: Self-pay | Admitting: Nurse Practitioner

## 2018-08-19 ENCOUNTER — Ambulatory Visit: Payer: Medicaid Other | Admitting: Nurse Practitioner

## 2018-08-30 NOTE — Progress Notes (Deleted)
ASSESSMENT / PLAN:   Repeat CBC   HPI:    Chief Complaint:     Hgb at last check in early November was 10.2, down ~ one gram from late October.   Data Reviewed:      Past Medical History:  Diagnosis Date  . Anemia   . Anxiety   . Asthma    "seasona" when allergies are bad  . Chronic back pain   . Fibroid   . GERD (gastroesophageal reflux disease)   . Poor dental hygiene   . Sleep apnea    does not use or have CPAP  . SVD (spontaneous vaginal delivery)    x 2  . Vaginal Pap smear, abnormal    not had pap smear since 2012     Past Surgical History:  Procedure Laterality Date  . bilateral ankle surgery  1989, 2008   x 2 left 1989, right 2008  . CESAREAN SECTION     x 1  . EXPLORATORY LAPAROTOMY  2006   ovarian cyst removal  . MULTIPLE TOOTH EXTRACTIONS    . RHINOPLASTY  1994  . TONSILLECTOMY  1984  . TUBAL LIGATION    . VAGINAL HYSTERECTOMY N/A 05/16/2018   Procedure: HYSTERECTOMY VAGINAL;  Surgeon: [name], MD;  Location: [facility];  Service: Gynecology;  Laterality: N/A;  . WISDOM TOOTH EXTRACTION     No family history on file. Social History   Tobacco Use  . Smoking status: Current Every Day Smoker    Packs/day: 1.00    Years: 29.00    Pack years: 29.00    Types: Cigarettes  . Smokeless tobacco: Never Used  Substance Use Topics  . Alcohol use: Not Currently  . Drug use: No   Current Outpatient Medications  Medication Sig Dispense Refill  . albuterol (PROVENTIL HFA;VENTOLIN HFA) 108 (90 BASE) MCG/ACT inhaler Inhale 1-2 puffs into the lungs every 6 (six) hours as needed for wheezing or shortness of breath.    . cyclobenzaprine (FLEXERIL) 10 MG tablet Take 10 mg by mouth 3 (three) times daily as needed for muscle spasms.    Marland Kitchen gabapentin (NEURONTIN) 300 MG capsule Take 300 mg by mouth 3 (three) times daily.     . hydrochlorothiazide (HYDRODIURIL) 25 MG tablet Take 1 tablet (25 mg total) by mouth daily. 90 tablet 3  .  ibuprofen (ADVIL,MOTRIN) 800 MG tablet Take 1 tablet (800 mg total) by mouth every 8 (eight) hours as needed. (Patient not taking: Reported on 07/14/2018) 30 tablet 0  . methocarbamol (ROBAXIN) 500 MG tablet Take 1 tablet (500 mg total) by mouth 4 (four) times daily. (Patient not taking: Reported on 05/02/2018) 30 tablet 0  . omeprazole (PRILOSEC) 20 MG capsule Take 20 mg by mouth daily.    Marland Kitchen oxyCODONE-acetaminophen (PERCOCET/ROXICET) 5-325 MG tablet Take 1 tablet by mouth every 4 (four) hours as needed for severe pain.     No current facility-administered medications for this visit.    Allergies  Allergen Reactions  . Pantoprazole Anaphylaxis  . Celebrex [Celecoxib] Other (See Comments)    Joints freeze up  . Diclofenac Sodium Other (See Comments)    REACTION: jittery, chest pains; anxiety; unable to void  . Escitalopram Oxalate Other (See Comments)    REACTION: jittery; anuria; hyperirritability  . Levaquin [Levofloxacin In D5w] Hives  . Rofecoxib Other (See Comments)    Joints freeze up  .  Ultram [Tramadol] Nausea And Vomiting  . Gelatin Palpitations     Review of Systems: All systems reviewed and negative except where noted in HPI.   Creatinine clearance cannot be calculated (Patient's most recent lab result is older than the maximum 21 days allowed.)   Physical Exam:    Wt Readings from Last 3 Encounters:  07/14/18 232 lb 14.4 oz (105.6 kg)  06/15/18 236 lb (107 kg)  05/06/18 234 lb 6 oz (106.3 kg)    LMP 04/12/2018 (Approximate)  Constitutional:  Pleasant female in no acute distress. Psychiatric: Normal mood and affect. Behavior is normal. EENT: Pupils normal.  Conjunctivae are normal. No scleral icterus. Neck supple.  Cardiovascular: Normal rate, regular rhythm. No edema Pulmonary/chest: Effort normal and breath sounds normal. No wheezing, rales or rhonchi. Abdominal: Soft, nondistended, nontender. Bowel sounds active throughout. There are no masses palpable. No  hepatomegaly. Neurological: Alert and oriented to person place and time. Skin: Skin is warm and dry. No rashes noted.  Tye Savoy, NP  08/30/2018, 9:24 PM   [name], MD

## 2018-08-31 ENCOUNTER — Ambulatory Visit: Payer: Medicaid Other | Admitting: Nurse Practitioner

## 2018-09-12 ENCOUNTER — Encounter: Payer: Self-pay | Admitting: *Deleted

## 2018-12-12 DIAGNOSIS — E119 Type 2 diabetes mellitus without complications: Secondary | ICD-10-CM

## 2018-12-12 HISTORY — DX: Type 2 diabetes mellitus without complications: E11.9

## 2019-06-13 DIAGNOSIS — I10 Essential (primary) hypertension: Secondary | ICD-10-CM

## 2019-06-13 HISTORY — DX: Essential (primary) hypertension: I10

## 2019-11-27 ENCOUNTER — Ambulatory Visit: Payer: Self-pay | Admitting: Surgery

## 2019-11-27 NOTE — H&P (Signed)
History of Present Illness Sonya Berry. Heleena Miceli MD; 11/27/2019 10:10 AM) The patient is a 49 year old female who presents for evaluation of gall stones. Referred by Fredrich Romans, PA-C from [facility]  This is a 49 year old female with obesity, hepatic steatosis, continued tobacco abuse who presents with over 20 years of reflux symptoms. Over the last few years she has developed intermittent severe episodes of postprandial diarrhea and some mild right upper quadrant pain. She has a lot of back pain so she cannot tell if there is any difference in her back pain. She saw  Primary care provider earlier this year who noted abnormal liver function tests with elevated AST and ALT. total bilirubin was normal. She obtained an ultrasound which showed multiple gallstones. Common bile duct was normal. She is now referred for surgical evaluation.   Problem List/Past Medical Rodman Key K. Vickki Igou, MD; 11/27/2019 10:10 AM) CHRONIC CHOLECYSTITIS WITH CALCULUS (K80.10)  Past Surgical History Sabino Gasser, Hope; 11/27/2019 9:15 AM) Cesarean Section - 1 Foot Surgery Bilateral. Hysterectomy (not due to cancer) - Partial Oral Surgery Tonsillectomy  Diagnostic Studies History Sabino Gasser, CMA; 11/27/2019 9:15 AM) Colonoscopy never Mammogram within last year Pap Smear 1-5 years ago  Allergies Rodman Key K. Demetrick Eichenberger, MD; 11/27/2019 10:10 AM) Proteinex *NUTRIENTS* traMADol HCl *ANALGESICS - OPIOID* Protonix *ULCER DRUGS/ANTISPASMODICS/ANTICHOLINERGICS* Anaphylaxis.  Medication History Sabino Gasser, CMA; 11/27/2019 9:18 AM) oxyCODONE-Acetaminophen (7.5-325MG  Tablet, Oral) Active. metFORMIN HCl (500MG  Tablet, Oral) Active. Gabapentin (300MG  Capsule, Oral) Active. Lisinopril (10MG  Tablet, Oral) Active. Albuterol Sulfate HFA (108 (90 Base)MCG/ACT Aerosol Soln, Inhalation) Active. Victoza (18MG /3ML Soln Pen-inj, Subcutaneous) Active. Medications Reconciled  Social History Sabino Gasser, CMA; 11/27/2019 9:15 AM) Alcohol use Remotely quit alcohol use. Caffeine use Carbonated beverages, Coffee. No drug use Tobacco use Current every day smoker.  Family History Sabino Gasser, Scott; 11/27/2019 9:15 AM) Arthritis Father, Mother. Depression Mother. Diabetes Mellitus Mother. Hypertension Mother. Melanoma Father, Mother. Migraine Headache Mother. Thyroid problems Mother.  Pregnancy / Birth History Sabino Gasser, B[place]; 11/27/2019 9:15 AM) Age at menarche 53 years. Gravida 4 Irregular periods Maternal age 91-25 Para 3  Other Problems Sonya Berry. Tabbatha Bordelon, MD; 11/27/2019 10:10 AM) Anxiety Disorder Arthritis Back Pain Cholelithiasis Diabetes Mellitus Gastroesophageal Reflux Disease High blood pressure     Review of Systems Sabino Gasser CMA; 11/27/2019 9:15 AM) General Present- Fatigue. Not Present- Appetite Loss, Chills, Fever, Night Sweats, Weight Gain and Weight Loss. Skin Present- Dryness. Not Present- Change in Wart/Mole, Hives, Jaundice, New Lesions, Non-Healing Wounds, Rash and Ulcer. HEENT Present- Oral Ulcers, Seasonal Allergies and Wears glasses/contact lenses. Not Present- Earache, Hearing Loss, Hoarseness, Nose Bleed, Ringing in the Ears, Sinus Pain, Sore Throat, Visual Disturbances and Yellow Eyes. Respiratory Present- Snoring. Not Present- Bloody sputum, Chronic Cough, Difficulty Breathing and Wheezing. Breast Not Present- Breast Mass, Breast Pain, Nipple Discharge and Skin Changes. Cardiovascular Not Present- Chest Pain, Difficulty Breathing Lying Down, Leg Cramps, Palpitations, Rapid Heart Rate, Shortness of Breath and Swelling of Extremities. Gastrointestinal Present- Abdominal Pain, Bloating, Chronic diarrhea, Constipation and Indigestion. Not Present- Bloody Stool, Change in Bowel Habits, Difficulty Swallowing, Excessive gas, Gets full quickly at meals, Hemorrhoids, Nausea, Rectal Pain and Vomiting. Female Genitourinary Not  Present- Frequency, Nocturia, Painful Urination, Pelvic Pain and Urgency. Musculoskeletal Present- Back Pain, Joint Pain, Joint Stiffness and Swelling of Extremities. Not Present- Muscle Pain and Muscle Weakness. Neurological Present- Numbness, Tingling and Tremor. Not Present- Decreased Memory, Fainting, Headaches, Seizures, Trouble walking and Weakness. Psychiatric Present- Anxiety. Not Present- Bipolar, Change in Sleep Pattern, Depression, Fearful  and Frequent crying. Endocrine Present- Heat Intolerance and Hot flashes. Not Present- Cold Intolerance, Excessive Hunger, Hair Changes and New Diabetes.  Vitals Sabino Gasser CMA; 11/27/2019 9:19 AM) 11/27/2019 9:18 AM Weight: 209.2 lb Height: 63in Body Surface Area: 1.97 m Body Mass Index: 37.06 kg/m  Temp.: 97.107F(Tympanic)  Pulse: 113 (Regular)  BP: 132/80(Sitting, Left Arm, Standard)        Physical Exam Rodman Key K. Jaye Polidori MD; 11/27/2019 10:10 AM)  The physical exam findings are as follows: Note:Constitutional: WDWN in NAD, conversant, no obvious deformities; resting comfortably Eyes: Pupils equal, round; sclera anicteric; moist conjunctiva; no lid lag HENT: Oral mucosa moist; good dentition Neck: No masses palpated, trachea midline; no thyromegaly Lungs: CTA bilaterally; normal respiratory effort CV: Regular rate and rhythm; no murmurs; extremities well-perfused with no edema Abd: +bowel sounds, soft,mildly tender in RUQ, no palpable organomegaly; no palpable hernias Musc: Normal gait; no apparent clubbing or cyanosis in extremities Lymphatic: No palpable cervical or axillary lymphadenopathy Skin: Warm, dry; no sign of jaundice Psychiatric - alert and oriented x 4; calm mood and affect    Assessment & Plan Rodman Key K. Leman Martinek MD; 11/27/2019 9:33 AM)  CHRONIC CHOLECYSTITIS WITH CALCULUS (K80.10)  Current Plans Schedule for Surgery - Laparoscopic cholecystectomy with intraoperative cholangiogram. The surgical  procedure has been discussed with the patient. Potential risks, benefits, alternative treatments, and expected outcomes have been explained. All of the patient's questions at this time have been answered. The likelihood of reaching the patient's treatment goal is good. The patient understand the proposed surgical procedure and wishes to proceed.  Sonya Berry. Georgette Dover, MD, [facility]  General/ Trauma Surgery   11/27/2019 10:11 AM

## 2019-12-26 NOTE — Pre-Procedure Instructions (Signed)
[facility], [place] [address] Phone: [phone] Fax: [phone]  [id number] [place], [address] [place] [address] Phone: [phone] Fax: [phone]    Your procedure is scheduled on Wed., January 03, 2020 from 11:30AM-12:45PM  Report to [facility] Entrance "A" at 9:30AM  Call this number if you have problems the morning of surgery:  [phone]   Remember:  Do not eat after midnight on June 22nd  You may drink clear liquids until 3 hours (8:30AM) prior to surgery time .  Clear liquids allowed are: Water, Juice (non-citric and without pulp - diabetics please choose diet or no sugar options), Carbonated beverages - (diabetics please choose diet or no sugar options), Clear Tea, Black Coffee only (no creamer, milk or cream including half and half), Plain Jell-O only (diabetics please choose diet or no sugar options), Gatorade (diabetics please choose diet or no sugar options) and Plain Popsicles only    Take these medicines the morning of surgery with A SIP OF WATER: Omeprazole (PRILOSEC)      If Needed:  OxyCODONE-acetaminophen (PERCOCET)     Restasis Eye drops Albuterol Inhaler- bring with you the day of surgery  As of today, STOP taking all Aspirin (unless instructed by your doctor) and Other Aspirin containing products, Vitamins, Fish oils, and Herbal medications. Also stop all NSAIDS i.e. Advil, Ibuprofen, Motrin, Aleve, Anaprox, Naproxen, BC, Goody Powders, and all Supplements.  . Do not take MetFORMIN (GLUCOPHAGE)  the morning of surgery.  . The day of surgery, do not take other diabetes injectable Victoza (liraglutide).   How to Manage Your Diabetes Before and After Surgery  Why is it important to control my blood sugar before and after surgery? . Improving blood sugar levels before and after surgery helps healing and can  limit problems. . A way of improving blood sugar control is eating a healthy diet by: o  Eating less sugar and carbohydrates o  Increasing activity/exercise o  Talking with your doctor about reaching your blood sugar goals . High blood sugars (greater than 180 mg/dL) can raise your risk of infections and slow your recovery, so you will need to focus on controlling your diabetes during the weeks before surgery. . Make sure that the doctor who takes care of your diabetes knows about your planned surgery including the date and location.  How do I manage my blood sugar before surgery? . Check your blood sugar at least 4 times a day, starting 2 days before surgery, to make sure that the level is not too high or low. o Check your blood sugar the morning of your surgery when you wake up and every 2 hours until you get to the Short Stay unit. . If your blood sugar is less than 70 mg/dL, you will need to treat for low blood sugar: o Do not take insulin. o Treat a low blood sugar (less than 70 mg/dL) with  cup of clear juice (cranberry or apple), 4 glucose tablets, OR glucose gel. Recheck blood sugar in 15 minutes after treatment (to make sure it is greater than 70 mg/dL). If your blood sugar is not greater than 70 mg/dL on recheck, call [phone] o  for further instructions.                                        Marland Kitchen  If your CBG is greater than 220 mg/dL, call the number above for further instructions.  . If you are admitted to the hospital after surgery: o Your blood sugar will be checked by the staff and you will probably be given insulin after surgery (instead of oral diabetes medicines) to make sure you have good blood sugar levels. o The goal for blood sugar control after surgery is 80-180 mg/dL.  Reviewed and Endorsed by [facility] Patient Education Committee, August 2015   No Smoking of any kind, Tobacco, or Alcohol products 24 hours prior to your procedure. If you use a Cpap at night,  you may bring all equipment for your overnight stay.   Special instructions:   [place]- Preparing For Surgery  Before surgery, you can play an important role. Because skin is not sterile, your skin needs to be as free of germs as possible. You can reduce the number of germs on your skin by washing with CHG (chlorahexidine gluconate) Soap before surgery.  CHG is an antiseptic cleaner which kills germs and bonds with the skin to continue killing germs even after washing.    Please do not use if you have an allergy to CHG or antibacterial soaps. If your skin becomes reddened/irritated stop using the CHG.  Do not shave (including legs and underarms) for at least 48 hours prior to first CHG shower. It is OK to shave your face.  Please follow these instructions carefully.   1. Shower the NIGHT BEFORE SURGERY and the MORNING OF SURGERY with CHG.   2. If you chose to wash your hair, wash your hair first as usual with your normal shampoo.  3. After you shampoo, rinse your hair and body thoroughly to remove the shampoo.  4. Use CHG as you would any other liquid soap. You can apply CHG directly to the skin and wash gently with a scrungie or a clean washcloth.   5. Apply the CHG Soap to your body ONLY FROM THE NECK DOWN.  Do not use on open wounds or open sores. Avoid contact with your eyes, ears, mouth and genitals (private parts). Wash Face and genitals (private parts)  with your normal soap.  6. Wash thoroughly, paying special attention to the area where your surgery will be performed.  7. Thoroughly rinse your body with warm water from the neck down.  8. DO NOT shower/wash with your normal soap after using and rinsing off the CHG Soap.  9. Pat yourself dry with a CLEAN TOWEL.  10. Wear CLEAN PAJAMAS to bed the night before surgery, wear comfortable clothes the morning of surgery  11. Place CLEAN SHEETS on your bed the night of your first shower and DO NOT SLEEP WITH PETS.  Day of  Surgery:            Remember to brush your teeth WITH YOUR REGULAR TOOTHPASTE.  Do not wear jewelry, make-up or nail polish.  Do not wear lotions, powders, or perfumes, or deodorant.  Do not shave 48 hours prior to surgery.    Do not bring valuables to the hospital.  [facility] is not responsible for any belongings or valuables.  Contacts, dentures or bridgework may not be worn into surgery.   For patients admitted to the hospital, discharge time will be determined by your treatment team.  Patients discharged the day of surgery will not be allowed to drive home, and someone age 75 and over needs to stay with them for 24 hours.  Please wear clean clothes to the hospital/surgery center.    Please read over the following fact sheets that you were given.

## 2019-12-27 ENCOUNTER — Inpatient Hospital Stay (HOSPITAL_COMMUNITY)
Admission: RE | Admit: 2019-12-27 | Discharge: 2019-12-27 | Disposition: A | Discharge status: Home / Self Care | Payer: Medicaid Other | Source: Ambulatory Visit

## 2019-12-30 ENCOUNTER — Other Ambulatory Visit (HOSPITAL_COMMUNITY)
Admission: RE | Admit: 2019-12-30 | Discharge: 2019-12-30 | Disposition: A | Discharge status: Home / Self Care | Payer: Medicaid Other | Source: Ambulatory Visit | Attending: Surgery | Admitting: Surgery

## 2019-12-30 DIAGNOSIS — Z20822 Contact with and (suspected) exposure to covid-19: Secondary | ICD-10-CM | POA: Insufficient documentation

## 2019-12-30 DIAGNOSIS — Z01812 Encounter for preprocedural laboratory examination: Secondary | ICD-10-CM | POA: Diagnosis present

## 2019-12-30 LAB — SARS CORONAVIRUS 2 (TAT 6-24 HRS): SARS Coronavirus 2: NEGATIVE

## 2020-01-01 ENCOUNTER — Other Ambulatory Visit: Payer: Self-pay

## 2020-01-01 ENCOUNTER — Encounter (HOSPITAL_COMMUNITY): Payer: Self-pay

## 2020-01-01 ENCOUNTER — Encounter (HOSPITAL_COMMUNITY)
Admission: RE | Admit: 2020-01-01 | Discharge: 2020-01-01 | Disposition: A | Discharge status: Home / Self Care | Payer: Medicaid Other | Source: Ambulatory Visit | Attending: Surgery | Admitting: Surgery

## 2020-01-01 DIAGNOSIS — Z01812 Encounter for preprocedural laboratory examination: Secondary | ICD-10-CM | POA: Diagnosis present

## 2020-01-01 LAB — CBC
HCT: 44 % (ref 36.0–46.0)
Hemoglobin: 14.3 g/dL (ref 12.0–15.0)
MCH: 29.2 pg (ref 26.0–34.0)
MCHC: 32.5 g/dL (ref 30.0–36.0)
MCV: 90 fL (ref 80.0–100.0)
Platelets: 497 10*3/uL — ABNORMAL HIGH (ref 150–400)
RBC: 4.89 MIL/uL (ref 3.87–5.11)
RDW: 14.3 % (ref 11.5–15.5)
WBC: 11.5 10*3/uL — ABNORMAL HIGH (ref 4.0–10.5)
nRBC: 0 % (ref 0.0–0.2)

## 2020-01-01 LAB — BASIC METABOLIC PANEL
Anion gap: 10 (ref 5–15)
BUN: 6 mg/dL (ref 6–20)
CO2: 25 mmol/L (ref 22–32)
Calcium: 9.4 mg/dL (ref 8.9–10.3)
Chloride: 105 mmol/L (ref 98–111)
Creatinine, Ser: 0.68 mg/dL (ref 0.44–1.00)
GFR calc Af Amer: >60 mL/min (ref >60)
GFR calc non Af Amer: >60 mL/min (ref >60)
Glucose, Bld: 179 mg/dL — ABNORMAL HIGH (ref 70–99)
Potassium: 3.6 mmol/L (ref 3.5–5.1)
Sodium: 140 mmol/L (ref 135–145)

## 2020-01-01 LAB — GLUCOSE, CAPILLARY: Glucose-Capillary: 166 mg/dL — ABNORMAL HIGH (ref 70–99)

## 2020-01-01 NOTE — Progress Notes (Signed)
PCP - Fredrich Romans PA [facility] Cardiologist - Denies  Chest x-ray - Not indicated EKG - requested from [place] - Denies ECHO - Nov 2020 Cardiac Cath - Denies  Sleep Study - Yes has OSA CPAP - Not using  DM - Yes type II CBG  166 Checks Blood Sugar ___5__ times a week  ERAS Protcol - Yes   COVID TEST- 12/30/19  Anesthesia review: No   Patient denies shortness of breath, fever, cough and chest pain at PAT appointment  All instructions explained to the patient, with a verbal understanding of the material. Patient agrees to go over the instructions while at home for a better understanding. Patient also instructed to self quarantine after being tested for COVID-19. The opportunity to ask questions was provided.

## 2020-01-02 LAB — HEMOGLOBIN A1C
Hgb A1c MFr Bld: 6.5 % — ABNORMAL HIGH (ref 4.8–5.6)
Mean Plasma Glucose: 140 mg/dL

## 2020-01-02 NOTE — Anesthesia Preprocedure Evaluation (Addendum)
Anesthesia Evaluation  Patient identified by MRN, date of birth, ID band Patient awake    Reviewed: Allergy & Precautions, NPO status , Patient's Chart, lab work & pertinent test results  History of Anesthesia Complications Negative for: history of anesthetic complications  Airway Mallampati: II  TM Distance: >3 FB Neck ROM: Full    Dental  (+) Missing, Poor Dentition, Dental Advidsory Given   Pulmonary asthma , sleep apnea , Current Smoker and Patient abstained from smoking.,    Pulmonary exam normal        Cardiovascular hypertension, Pt. on medications Normal cardiovascular exam     Neuro/Psych PSYCHIATRIC DISORDERS Anxiety Depression negative neurological ROS     GI/Hepatic Neg liver ROS, GERD  Medicated and Controlled,  Endo/Other  diabetes  Renal/GU negative Renal ROS  negative genitourinary   Musculoskeletal Chronic lumbar back pain   Abdominal (+) - obese,   Peds  Hematology  (+) Blood dyscrasia, anemia ,   Anesthesia Other Findings   Reproductive/Obstetrics Menorrhagia Uterine fibroids  Hx/o endometriosis                           Anesthesia Physical  Anesthesia Plan  ASA: III  Anesthesia Plan: General   Post-op Pain Management:    Induction: Intravenous  PONV Risk Score and Plan: Ondansetron, Dexamethasone, Scopolamine patch - Pre-op, Midazolam and Treatment may vary due to age or medical condition  Airway Management Planned: Oral ETT  Additional Equipment:   Intra-op Plan:   Post-operative Plan: Extubation in OR  Informed Consent: I have reviewed the patients History and Physical, chart, labs and discussed the procedure including the risks, benefits and alternatives for the proposed anesthesia with the patient or authorized representative who has indicated his/her understanding and acceptance.     Dental advisory given and Dental Advisory Given  Plan  Discussed with: CRNA, Anesthesiologist and Surgeon  Anesthesia Plan Comments:       Anesthesia Quick Evaluation                                  Anesthesia Evaluation    Airway Mallampati: II  TM Distance: >3 FB Neck ROM: Full    Dental  (+) Poor Dentition   Pulmonary asthma , sleep apnea , Current Smoker,    Pulmonary exam normal breath sounds clear to auscultation       Cardiovascular hypertension, Normal cardiovascular exam Rhythm:Regular Rate:Normal     Neuro/Psych PSYCHIATRIC DISORDERS Anxiety Depression negative neurological ROS     GI/Hepatic Neg liver ROS, GERD  Medicated and Controlled,  Endo/Other  Morbid obesity  Renal/GU negative Renal ROS  negative genitourinary   Musculoskeletal Chronic lumbar back pain   Abdominal (+) + obese,   Peds  Hematology  (+) anemia ,   Anesthesia Other Findings   Reproductive/Obstetrics Menorrhagia Uterine fibroids  Hx/o endometriosis                             Anesthesia Physical Anesthesia Plan  ASA: III  Anesthesia Plan: General   Post-op Pain Management:    Induction: Intravenous  PONV Risk Score and Plan: Ondansetron, Dexamethasone, Scopolamine patch - Pre-op, Midazolam and Treatment may vary due to age or medical condition  Airway Management Planned: Oral ETT  Additional Equipment:   Intra-op Plan:   Post-operative Plan: Extubation in OR  Informed Consent: I have reviewed the patients History and Physical, chart, labs and discussed the procedure including the risks, benefits and alternatives for the proposed anesthesia with the patient or authorized representative who has indicated his/her understanding and acceptance.   Dental advisory given  Plan Discussed with: CRNA and Surgeon  Anesthesia Plan Comments:         Anesthesia Quick Evaluation                                   Anesthesia Evaluation    Airway Mallampati: II  TM Distance: >3  FB Neck ROM: Full    Dental  (+) Poor Dentition   Pulmonary asthma , sleep apnea , Current Smoker,    Pulmonary exam normal breath sounds clear to auscultation       Cardiovascular hypertension, Normal cardiovascular exam Rhythm:Regular Rate:Normal     Neuro/Psych PSYCHIATRIC DISORDERS Anxiety Depression negative neurological ROS     GI/Hepatic Neg liver ROS, GERD  Medicated and Controlled,  Endo/Other  Morbid obesity  Renal/GU negative Renal ROS  negative genitourinary   Musculoskeletal Chronic lumbar back pain   Abdominal (+) + obese,   Peds  Hematology  (+) anemia ,   Anesthesia Other Findings   Reproductive/Obstetrics Menorrhagia Uterine fibroids  Hx/o endometriosis                             Anesthesia Physical Anesthesia Plan  ASA: III  Anesthesia Plan: General   Post-op Pain Management:    Induction: Intravenous  PONV Risk Score and Plan: Ondansetron, Dexamethasone, Scopolamine patch - Pre-op, Midazolam and Treatment may vary due to age or medical condition  Airway Management Planned: Oral ETT  Additional Equipment:   Intra-op Plan:   Post-operative Plan: Extubation in OR  Informed Consent: I have reviewed the patients History and Physical, chart, labs and discussed the procedure including the risks, benefits and alternatives for the proposed anesthesia with the patient or authorized representative who has indicated his/her understanding and acceptance.   Dental advisory given  Plan Discussed with: CRNA and Surgeon  Anesthesia Plan Comments:         Anesthesia Quick Evaluation

## 2020-01-03 ENCOUNTER — Ambulatory Visit (HOSPITAL_COMMUNITY): Payer: Medicaid Other | Admitting: Anesthesiology

## 2020-01-03 ENCOUNTER — Ambulatory Visit (HOSPITAL_COMMUNITY)
Admission: RE | Admit: 2020-01-03 | Discharge: 2020-01-03 | Disposition: A | Discharge status: Home / Self Care | Payer: Medicaid Other | Attending: Surgery | Admitting: Surgery

## 2020-01-03 ENCOUNTER — Encounter (HOSPITAL_COMMUNITY)
Admission: RE | Disposition: A | Discharge status: Home / Self Care | Payer: Self-pay | Source: Home / Self Care | Attending: Surgery

## 2020-01-03 ENCOUNTER — Encounter (HOSPITAL_COMMUNITY): Payer: Self-pay | Admitting: Surgery

## 2020-01-03 ENCOUNTER — Ambulatory Visit (HOSPITAL_COMMUNITY): Payer: Medicaid Other

## 2020-01-03 DIAGNOSIS — M1990 Unspecified osteoarthritis, unspecified site: Secondary | ICD-10-CM | POA: Diagnosis not present

## 2020-01-03 DIAGNOSIS — Z886 Allergy status to analgesic agent status: Secondary | ICD-10-CM | POA: Diagnosis not present

## 2020-01-03 DIAGNOSIS — Z79899 Other long term (current) drug therapy: Secondary | ICD-10-CM | POA: Diagnosis not present

## 2020-01-03 DIAGNOSIS — K8010 Calculus of gallbladder with chronic cholecystitis without obstruction: Secondary | ICD-10-CM | POA: Diagnosis present

## 2020-01-03 DIAGNOSIS — E669 Obesity, unspecified: Secondary | ICD-10-CM | POA: Diagnosis not present

## 2020-01-03 DIAGNOSIS — J45909 Unspecified asthma, uncomplicated: Secondary | ICD-10-CM | POA: Diagnosis not present

## 2020-01-03 DIAGNOSIS — Z87892 Personal history of anaphylaxis: Secondary | ICD-10-CM | POA: Diagnosis not present

## 2020-01-03 DIAGNOSIS — F17200 Nicotine dependence, unspecified, uncomplicated: Secondary | ICD-10-CM | POA: Insufficient documentation

## 2020-01-03 DIAGNOSIS — Z885 Allergy status to narcotic agent status: Secondary | ICD-10-CM | POA: Insufficient documentation

## 2020-01-03 DIAGNOSIS — Z833 Family history of diabetes mellitus: Secondary | ICD-10-CM | POA: Diagnosis not present

## 2020-01-03 DIAGNOSIS — Z7984 Long term (current) use of oral hypoglycemic drugs: Secondary | ICD-10-CM | POA: Insufficient documentation

## 2020-01-03 DIAGNOSIS — Z419 Encounter for procedure for purposes other than remedying health state, unspecified: Secondary | ICD-10-CM

## 2020-01-03 DIAGNOSIS — I10 Essential (primary) hypertension: Secondary | ICD-10-CM | POA: Diagnosis not present

## 2020-01-03 DIAGNOSIS — E119 Type 2 diabetes mellitus without complications: Secondary | ICD-10-CM | POA: Diagnosis not present

## 2020-01-03 DIAGNOSIS — Z6835 Body mass index (BMI) 35.0-35.9, adult: Secondary | ICD-10-CM | POA: Diagnosis not present

## 2020-01-03 DIAGNOSIS — G4730 Sleep apnea, unspecified: Secondary | ICD-10-CM | POA: Diagnosis not present

## 2020-01-03 DIAGNOSIS — K760 Fatty (change of) liver, not elsewhere classified: Secondary | ICD-10-CM | POA: Insufficient documentation

## 2020-01-03 DIAGNOSIS — Z8249 Family history of ischemic heart disease and other diseases of the circulatory system: Secondary | ICD-10-CM | POA: Diagnosis not present

## 2020-01-03 DIAGNOSIS — Z8261 Family history of arthritis: Secondary | ICD-10-CM | POA: Insufficient documentation

## 2020-01-03 HISTORY — PX: CHOLECYSTECTOMY: SHX55

## 2020-01-03 LAB — GLUCOSE, CAPILLARY
Glucose-Capillary: 123 mg/dL — ABNORMAL HIGH (ref 70–99)
Glucose-Capillary: 169 mg/dL — ABNORMAL HIGH (ref 70–99)

## 2020-01-03 SURGERY — LAPAROSCOPIC CHOLECYSTECTOMY WITH INTRAOPERATIVE CHOLANGIOGRAM
Anesthesia: General | Site: Abdomen

## 2020-01-03 MED ORDER — LACTATED RINGERS IV SOLN
INTRAVENOUS | Status: DC | PRN
  Administered 2020-01-03: 1000 mL via INTRAVENOUS

## 2020-01-03 MED ORDER — FENTANYL CITRATE (PF) 250 MCG/5ML IJ SOLN
INTRAMUSCULAR | Status: AC
  Filled 2020-01-03: qty 5

## 2020-01-03 MED ORDER — SODIUM CHLORIDE 0.9 % IR SOLN
Status: DC | PRN
  Administered 2020-01-03: 1000 mL

## 2020-01-03 MED ORDER — BUPIVACAINE HCL (PF) 0.25 % IJ SOLN
INTRAMUSCULAR | Status: AC
  Filled 2020-01-03: qty 30

## 2020-01-03 MED ORDER — CHLORHEXIDINE GLUCONATE CLOTH 2 % EX PADS: 6.0000 | MEDICATED_PAD | Freq: Once | CUTANEOUS | Status: DC

## 2020-01-03 MED ORDER — OXYCODONE HCL 5 MG PO TABS
5.0000 mg | ORAL_TABLET | Freq: Four times a day (QID) | ORAL | 0 refills | Status: AC | PRN
Start: 2020-01-03 — End: ?

## 2020-01-03 MED ORDER — DEXMEDETOMIDINE HCL 200 MCG/2ML IV SOLN
INTRAVENOUS | Status: DC | PRN
  Administered 2020-01-03 (×2): 4 ug via INTRAVENOUS

## 2020-01-03 MED ORDER — ORAL CARE MOUTH RINSE
15.0000 mL | Freq: Once | OROMUCOSAL | Status: AC
  Administered 2020-01-03: 15 mL via OROMUCOSAL

## 2020-01-03 MED ORDER — ONDANSETRON 4 MG PO TBDP: 4.0000 mg | ORAL_TABLET | Freq: Three times a day (TID) | ORAL | 0 refills | Status: AC | PRN

## 2020-01-03 MED ORDER — PROPOFOL 10 MG/ML IV BOLUS
INTRAVENOUS | Status: DC | PRN
  Administered 2020-01-03: 200 mg via INTRAVENOUS

## 2020-01-03 MED ORDER — SCOPOLAMINE 1 MG/3DAYS TD PT72: 1.0000 | MEDICATED_PATCH | TRANSDERMAL | Status: DC

## 2020-01-03 MED ORDER — LIDOCAINE 2% (20 MG/ML) 5 ML SYRINGE
INTRAMUSCULAR | Status: DC | PRN
  Administered 2020-01-03: 100 mg via INTRAVENOUS

## 2020-01-03 MED ORDER — SODIUM CHLORIDE 0.9 % IV SOLN
INTRAVENOUS | Status: DC | PRN
  Administered 2020-01-03: 15 mL

## 2020-01-03 MED ORDER — CEFAZOLIN SODIUM-DEXTROSE 2-4 GM/100ML-% IV SOLN
2.0000 g | INTRAVENOUS | Status: AC
  Administered 2020-01-03: 2 g via INTRAVENOUS

## 2020-01-03 MED ORDER — ROCURONIUM BROMIDE 10 MG/ML (PF) SYRINGE
PREFILLED_SYRINGE | INTRAVENOUS | Status: DC | PRN
  Administered 2020-01-03: 80 mg via INTRAVENOUS

## 2020-01-03 MED ORDER — SCOPOLAMINE 1 MG/3DAYS TD PT72
MEDICATED_PATCH | TRANSDERMAL | Status: AC
  Administered 2020-01-03: 1.5 mg via TRANSDERMAL
  Filled 2020-01-03: qty 1

## 2020-01-03 MED ORDER — BUPIVACAINE HCL (PF) 0.25 % IJ SOLN
INTRAMUSCULAR | Status: DC | PRN
  Administered 2020-01-03: 14 mL

## 2020-01-03 MED ORDER — SUGAMMADEX SODIUM 500 MG/5ML IV SOLN
INTRAVENOUS | Status: DC | PRN
  Administered 2020-01-03: 373.6 mg via INTRAVENOUS

## 2020-01-03 MED ORDER — CHLORHEXIDINE GLUCONATE 0.12 % MT SOLN
OROMUCOSAL | Status: AC
  Filled 2020-01-03: qty 15

## 2020-01-03 MED ORDER — ACETAMINOPHEN 500 MG PO TABS: 1000.0000 mg | ORAL_TABLET | Freq: Once | ORAL | Status: DC

## 2020-01-03 MED ORDER — CHLORHEXIDINE GLUCONATE 0.12 % MT SOLN: 15.0000 mL | Freq: Once | OROMUCOSAL | Status: AC

## 2020-01-03 MED ORDER — ACETAMINOPHEN 500 MG PO TABS: 1000.0000 mg | ORAL_TABLET | ORAL | Status: AC

## 2020-01-03 MED ORDER — ACETAMINOPHEN 500 MG PO TABS
ORAL_TABLET | ORAL | Status: AC
  Administered 2020-01-03: 1000 mg via ORAL
  Filled 2020-01-03: qty 2

## 2020-01-03 MED ORDER — 0.9 % SODIUM CHLORIDE (POUR BTL) OPTIME
TOPICAL | Status: DC | PRN
  Administered 2020-01-03: 1000 mL

## 2020-01-03 MED ORDER — FENTANYL CITRATE (PF) 100 MCG/2ML IJ SOLN
INTRAMUSCULAR | Status: AC
  Administered 2020-01-03: 25 ug
  Filled 2020-01-03: qty 2

## 2020-01-03 MED ORDER — CEFAZOLIN SODIUM-DEXTROSE 2-4 GM/100ML-% IV SOLN
INTRAVENOUS | Status: AC
  Filled 2020-01-03: qty 100

## 2020-01-03 MED ORDER — MIDAZOLAM HCL 2 MG/2ML IJ SOLN
INTRAMUSCULAR | Status: AC
  Filled 2020-01-03: qty 2

## 2020-01-03 MED ORDER — MIDAZOLAM HCL 5 MG/5ML IJ SOLN
INTRAMUSCULAR | Status: DC | PRN
  Administered 2020-01-03: 2 mg via INTRAVENOUS

## 2020-01-03 MED ORDER — PROPOFOL 10 MG/ML IV BOLUS
INTRAVENOUS | Status: AC
  Filled 2020-01-03: qty 20

## 2020-01-03 MED ORDER — FENTANYL CITRATE (PF) 100 MCG/2ML IJ SOLN
INTRAMUSCULAR | Status: DC | PRN
  Administered 2020-01-03 (×3): 50 ug via INTRAVENOUS

## 2020-01-03 MED ORDER — FENTANYL CITRATE (PF) 100 MCG/2ML IJ SOLN
25.0000 ug | INTRAMUSCULAR | Status: DC | PRN
  Administered 2020-01-03: 25 ug via INTRAVENOUS
  Administered 2020-01-03: 50 ug via INTRAVENOUS

## 2020-01-03 MED ORDER — ONDANSETRON HCL 4 MG/2ML IJ SOLN
INTRAMUSCULAR | Status: DC | PRN
  Administered 2020-01-03: 4 mg via INTRAVENOUS

## 2020-01-03 MED ORDER — PROMETHAZINE HCL 25 MG/ML IJ SOLN: 6.2500 mg | INTRAMUSCULAR | Status: DC | PRN

## 2020-01-03 SURGICAL SUPPLY — 45 items
APPLIER CLIP ROT 10 11.4 M/L (STAPLE) ×2
BENZOIN TINCTURE PRP APPL 2/3 (GAUZE/BANDAGES/DRESSINGS) ×2 IMPLANT
BLADE CLIPPER SURG (BLADE) IMPLANT
CANISTER SUCT 3000ML PPV (MISCELLANEOUS) ×2 IMPLANT
CHLORAPREP W/TINT 26 (MISCELLANEOUS) ×2 IMPLANT
CLIP APPLIE ROT 10 11.4 M/L (STAPLE) ×1 IMPLANT
CLOSURE STERI-STRIP 1/4X4 (GAUZE/BANDAGES/DRESSINGS) ×2 IMPLANT
COVER MAYO STAND STRL (DRAPES) ×2 IMPLANT
COVER SURGICAL LIGHT HANDLE (MISCELLANEOUS) ×2 IMPLANT
COVER WAND RF STERILE (DRAPES) ×2 IMPLANT
DRAPE C-ARM 42X120 X-RAY (DRAPES) ×2 IMPLANT
DRSG TEGADERM 2-3/8X2-3/4 SM (GAUZE/BANDAGES/DRESSINGS) ×2 IMPLANT
DRSG TEGADERM 4X4.75 (GAUZE/BANDAGES/DRESSINGS) ×2 IMPLANT
ELECT REM PT RETURN 9FT ADLT (ELECTROSURGICAL) ×2
ELECTRODE REM PT RTRN 9FT ADLT (ELECTROSURGICAL) ×1 IMPLANT
GAUZE SPONGE 2X2 8PLY STRL LF (GAUZE/BANDAGES/DRESSINGS) ×1 IMPLANT
GLOVE BIO SURGEON STRL SZ7 (GLOVE) ×2 IMPLANT
GLOVE BIOGEL PI IND STRL 7.5 (GLOVE) ×1 IMPLANT
GLOVE BIOGEL PI INDICATOR 7.5 (GLOVE) ×1
GOWN STRL REUS W/ TWL LRG LVL3 (GOWN DISPOSABLE) ×3 IMPLANT
GOWN STRL REUS W/TWL LRG LVL3 (GOWN DISPOSABLE) ×6
KIT BASIN OR (CUSTOM PROCEDURE TRAY) ×2 IMPLANT
KIT TURNOVER KIT B (KITS) ×2 IMPLANT
NS IRRIG 1000ML POUR BTL (IV SOLUTION) ×2 IMPLANT
PAD ARMBOARD 7.5X6 YLW CONV (MISCELLANEOUS) ×2 IMPLANT
POUCH RETRIEVAL ECOSAC 10 (ENDOMECHANICALS) IMPLANT
POUCH RETRIEVAL ECOSAC 10MM (ENDOMECHANICALS)
POUCH SPECIMEN RETRIEVAL 10MM (ENDOMECHANICALS) IMPLANT
SCISSORS LAP 5X35 DISP (ENDOMECHANICALS) ×2 IMPLANT
SET CHOLANGIOGRAPH 5 50 .035 (SET/KITS/TRAYS/PACK) ×2 IMPLANT
SET IRRIG TUBING LAPAROSCOPIC (IRRIGATION / IRRIGATOR) ×2 IMPLANT
SET TUBE SMOKE EVAC HIGH FLOW (TUBING) ×2 IMPLANT
SLEEVE ENDOPATH XCEL 5M (ENDOMECHANICALS) ×2 IMPLANT
SPECIMEN JAR SMALL (MISCELLANEOUS) ×2 IMPLANT
SPONGE GAUZE 2X2 8PLY STRL LF (GAUZE/BANDAGES/DRESSINGS) ×2 IMPLANT
SPONGE GAUZE 2X2 STER 10/PKG (GAUZE/BANDAGES/DRESSINGS) ×1
STRIP CLOSURE SKIN 1/2X4 (GAUZE/BANDAGES/DRESSINGS) ×2 IMPLANT
SUT MNCRL AB 4-0 PS2 18 (SUTURE) ×2 IMPLANT
TOWEL GREEN STERILE (TOWEL DISPOSABLE) ×2 IMPLANT
TOWEL GREEN STERILE FF (TOWEL DISPOSABLE) ×2 IMPLANT
TRAY LAPAROSCOPIC MC (CUSTOM PROCEDURE TRAY) ×2 IMPLANT
TROCAR XCEL BLUNT TIP 100MML (ENDOMECHANICALS) ×2 IMPLANT
TROCAR XCEL NON-BLD 11X100MML (ENDOMECHANICALS) ×2 IMPLANT
TROCAR XCEL NON-BLD 5MMX100MML (ENDOMECHANICALS) ×2 IMPLANT
WATER STERILE IRR 1000ML POUR (IV SOLUTION) ×2 IMPLANT

## 2020-01-03 NOTE — Anesthesia Postprocedure Evaluation (Signed)
Anesthesia Post Note  Patient: Sonya Berry  Procedure(s) Performed: LAPAROSCOPIC CHOLECYSTECTOMY WITH INTRAOPERATIVE CHOLANGIOGRAM (N/A Abdomen)     Patient location during evaluation: PACU Anesthesia Type: General Level of consciousness: sedated Pain management: pain level controlled Vital Signs Assessment: post-procedure vital signs reviewed and stable Respiratory status: spontaneous breathing and respiratory function stable Cardiovascular status: stable Postop Assessment: no apparent nausea or vomiting Anesthetic complications: no   No complications documented.  Last Vitals:  Vitals:   01/03/20 1045 01/03/20 1058  BP: (!) 161/92 (!) 140/92  Pulse: 70 71  Resp: 19 15  Temp:  36.7 C  SpO2:  94%    Last Pain:  Vitals:   01/03/20 1106  TempSrc:   PainSc: Asleep                 Antonis Lor DANIEL

## 2020-01-03 NOTE — H&P (Signed)
History of Present Illness  The patient is a 49 year old female who presents for evaluation of gall stones. Referred by Fredrich Romans, PA-C from [facility]  This is a 49 year old female with obesity, hepatic steatosis, continued tobacco abuse who presents with over 20 years of reflux symptoms. Over the last few years she has developed intermittent severe episodes of postprandial diarrhea and some mild right upper quadrant pain. She has a lot of back pain so she cannot tell if there is any difference in her back pain. She saw her Primary care provider earlier this year who noted abnormal liver function tests with elevated AST and ALT. total bilirubin was normal. She obtained an ultrasound which showed multiple gallstones. Common bile duct was normal. She is now referred for surgical evaluation.   Problem List/Past Medical  CHRONIC CHOLECYSTITIS WITH CALCULUS (K80.10)  Past Surgical History Cesarean Section - 1 Foot Surgery Bilateral. Hysterectomy (not due to cancer) - Partial Oral Surgery Tonsillectomy  Diagnostic Studies History Colonoscopy never Mammogram within last year Pap Smear 1-5 years ago  Allergies  Proteinex *NUTRIENTS* traMADol HCl *ANALGESICS - OPIOID* Protonix *ULCER DRUGS/ANTISPASMODICS/ANTICHOLINERGICS* Anaphylaxis.  Medication History oxyCODONE-Acetaminophen (7.5-325MG  Tablet, Oral) Active. metFORMIN HCl (500MG  Tablet, Oral) Active. Gabapentin (300MG  Capsule, Oral) Active. Lisinopril (10MG  Tablet, Oral) Active. Albuterol Sulfate HFA (108 (90 Base)MCG/ACT Aerosol Soln, Inhalation) Active. Victoza (18MG /3ML Soln Pen-inj, Subcutaneous) Active. Medications Reconciled  Social History Alcohol use Remotely quit alcohol use. Caffeine use Carbonated beverages, Coffee. No drug use Tobacco use Current every day smoker.  Family History Arthritis Father, Mother. Depression Mother. Diabetes Mellitus  Mother. Hypertension Mother. Melanoma Father, Mother. Migraine Headache Mother. Thyroid problems Mother.  Pregnancy / Birth History Age at menarche 19 years. Gravida 4 Irregular periods Maternal age 106-25 Para 3  Other Problems  Anxiety Disorder Arthritis Back Pain Cholelithiasis Diabetes Mellitus Gastroesophageal Reflux Disease High blood pressure     Review of Systems  General Present- Fatigue. Not Present- Appetite Loss, Chills, Fever, Night Sweats, Weight Gain and Weight Loss. Skin Present- Dryness. Not Present- Change in Wart/Mole, Hives, Jaundice, New Lesions, Non-Healing Wounds, Rash and Ulcer. HEENT Present- Oral Ulcers, Seasonal Allergies and Wears glasses/contact lenses. Not Present- Earache, Hearing Loss, Hoarseness, Nose Bleed, Ringing in the Ears, Sinus Pain, Sore Throat, Visual Disturbances and Yellow Eyes. Respiratory Present- Snoring. Not Present- Bloody sputum, Chronic Cough, Difficulty Breathing and Wheezing. Breast Not Present- Breast Mass, Breast Pain, Nipple Discharge and Skin Changes. Cardiovascular Not Present- Chest Pain, Difficulty Breathing Lying Down, Leg Cramps, Palpitations, Rapid Heart Rate, Shortness of Breath and Swelling of Extremities. Gastrointestinal Present- Abdominal Pain, Bloating, Chronic diarrhea, Constipation and Indigestion. Not Present- Bloody Stool, Change in Bowel Habits, Difficulty Swallowing, Excessive gas, Gets full quickly at meals, Hemorrhoids, Nausea, Rectal Pain and Vomiting. Female Genitourinary Not Present- Frequency, Nocturia, Painful Urination, Pelvic Pain and Urgency. Musculoskeletal Present- Back Pain, Joint Pain, Joint Stiffness and Swelling of Extremities. Not Present- Muscle Pain and Muscle Weakness. Neurological Present- Numbness, Tingling and Tremor. Not Present- Decreased Memory, Fainting, Headaches, Seizures, Trouble walking and Weakness. Psychiatric Present- Anxiety. Not Present- Bipolar,  Change in Sleep Pattern, Depression, Fearful and Frequent crying. Endocrine Present- Heat Intolerance and Hot flashes. Not Present- Cold Intolerance, Excessive Hunger, Hair Changes and New Diabetes.  Vitals  Weight: 209.2 lb Height: 63in Body Surface Area: 1.97 m Body Mass Index: 37.06 kg/m  Temp.: 97.60F(Tympanic)  Pulse: 113 (Regular)  BP: 132/80(Sitting, Left Arm, Standard)        Physical Exam  The physical exam  findings are as follows: Note:Constitutional: WDWN in NAD, conversant, no obvious deformities; resting comfortably Eyes: Pupils equal, round; sclera anicteric; moist conjunctiva; no lid lag HENT: Oral mucosa moist; good dentition Neck: No masses palpated, trachea midline; no thyromegaly Lungs: CTA bilaterally; normal respiratory effort CV: Regular rate and rhythm; no murmurs; extremities well-perfused with no edema Abd: +bowel sounds, soft,mildly tender in RUQ, no palpable organomegaly; no palpable hernias Musc: Normal gait; no apparent clubbing or cyanosis in extremities Lymphatic: No palpable cervical or axillary lymphadenopathy Skin: Warm, dry; no sign of jaundice Psychiatric - alert and oriented x 4; calm mood and affect    Assessment & Plan  CHRONIC CHOLECYSTITIS WITH CALCULUS (K80.10)  Current Plans Schedule for Surgery - Laparoscopic cholecystectomy with intraoperative cholangiogram. The surgical procedure has been discussed with the patient. Potential risks, benefits, alternative treatments, and expected outcomes have been explained. All of the patient's questions at this time have been answered. The likelihood of reaching the patient's treatment goal is good. The patient understand the proposed surgical procedure and wishes to proceed.  Imogene Burn. Georgette Dover, MD, [facility] Surgery  General/ Trauma Surgery   01/03/2020 8:24 AM

## 2020-01-03 NOTE — Anesthesia Procedure Notes (Signed)
Procedure Name: Intubation Date/Time: 01/03/2020 8:36 AM Performed by: Neldon Newport, CRNA Pre-anesthesia Checklist: Timeout performed, Patient being monitored, Suction available, Emergency Drugs available and Patient identified Patient Re-evaluated:Patient Re-evaluated prior to induction Oxygen Delivery Method: Circle system utilized Preoxygenation: Pre-oxygenation with 100% oxygen Induction Type: IV induction Ventilation: Mask ventilation without difficulty Laryngoscope Size: Mac and 3 Grade View: Grade I Tube type: Oral Tube size: 7.0 mm Number of attempts: 1 Placement Confirmation: ETT inserted through vocal cords under direct vision,  positive ETCO2 and breath sounds checked- equal and bilateral Secured at: 22 cm Tube secured with: Tape Dental Injury: Teeth and Oropharynx as per pre-operative assessment

## 2020-01-03 NOTE — Op Note (Signed)
Laparoscopic Cholecystectomy with IOC Procedure Note  Indications: This is a 49 year old female with obesity, hepatic steatosis, continued tobacco abuse who presents with over 20 years of reflux symptoms. Over the last few years she has developed intermittent severe episodes of postprandial diarrhea and some mild right upper quadrant pain. She has a lot of back pain so she cannot tell if there is any difference in her back pain. She saw  Primary care provider earlier this year who noted abnormal liver function tests with elevated AST and ALT. total bilirubin was normal. She obtained an ultrasound which showed multiple gallstones. Common bile duct was normal. She is now referred for surgical evaluation.  Pre-operative Diagnosis: Calculus of gallbladder with other cholecystitis, without mention of obstruction  Post-operative Diagnosis: Same  Surgeon: Maia Petties   Assistants: none  Anesthesia: General endotracheal anesthesia  ASA Class: 2  Procedure Details  The patient was seen again in the Holding Room. The risks, benefits, complications, treatment options, and expected outcomes were discussed with the patient. The possibilities of reaction to medication, pulmonary aspiration, perforation of viscus, bleeding, recurrent infection, finding a normal gallbladder, the need for additional procedures, failure to diagnose a condition, the possible need to convert to an open procedure, and creating a complication requiring transfusion or operation were discussed with the patient. The likelihood of improving the patient's symptoms with return to their baseline status is good.  The patient and/or family concurred with the proposed plan, giving informed consent. The site of surgery properly noted. The patient was taken to Operating Room, identified as Sonya Berry and the procedure verified as Laparoscopic Cholecystectomy with Intraoperative Cholangiogram. A Time Out was held and the above  information confirmed.  Prior to the induction of general anesthesia, antibiotic prophylaxis was administered. General endotracheal anesthesia was then administered and tolerated well. After the induction, the abdomen was prepped with Chloraprep and draped in the sterile fashion. The patient was positioned in the supine position.  Local anesthetic agent was injected into the skin near the umbilicus and an incision made. We dissected down to the abdominal fascia with blunt dissection.  The fascia was incised vertically and we entered the peritoneal cavity bluntly.  A pursestring suture of 0-Vicryl was placed around the fascial opening.  The Hasson cannula was inserted and secured with the stay suture.  Pneumoperitoneum was then created with CO2 and tolerated well without any adverse changes in the patient's vital signs. An 11-mm port was placed in the subxiphoid position.  Two 5-mm ports were placed in the right upper quadrant. All skin incisions were infiltrated with a local anesthetic agent before making the incision and placing the trocars.   We positioned the patient in reverse Trendelenburg, tilted slightly to the patient's left.  The gallbladder was identified, the fundus grasped and retracted cephalad. Adhesions were lysed bluntly and with the electrocautery where indicated, taking care not to injure any adjacent organs or viscus. The infundibulum was grasped and retracted laterally, exposing the peritoneum overlying the triangle of Calot. This was then divided and exposed in a blunt fashion. A critical view of the cystic duct and cystic artery was obtained.  The cystic duct was clearly identified and bluntly dissected circumferentially. The cystic duct was ligated with a clip distally.   An incision was made in the cystic duct and the [facility] cholangiogram catheter introduced. The catheter was secured using a clip. A cholangiogram was then obtained which showed good visualization of the distal and proximal  biliary tree  with no sign of filling defects or obstruction.  Contrast flowed easily into the duodenum. The catheter was then removed.   The cystic duct was then ligated with clips and divided. The cystic artery was identified, dissected free, ligated with clips and divided as well.   The gallbladder was dissected from the liver bed in retrograde fashion with the electrocautery. The gallbladder was removed and placed in an Endocatch sac. The liver bed was irrigated and inspected. Hemostasis was achieved with the electrocautery. Copious irrigation was utilized and was repeatedly aspirated until clear.  The gallbladder and Endocatch sac were then removed through the umbilical port site.  The pursestring suture was used to close the umbilical fascia.    We again inspected the right upper quadrant for hemostasis.  Pneumoperitoneum was released as we removed the trocars.  4-0 Monocryl was used to close the skin.   Benzoin, steri-strips, and clean dressings were applied. The patient was then extubated and brought to the recovery room in stable condition. Instrument, sponge, and needle counts were correct at closure and at the conclusion of the case.   Findings: Cholecystitis with Cholelithiasis  Estimated Blood Loss: Minimal         Drains: none         Specimens: Gallbladder           Complications: None; patient tolerated the procedure well.         Disposition: PACU - hemodynamically stable.         Condition: stable  Imogene Burn. Georgette Dover, MD, [facility]  General/ Trauma Surgery   01/03/2020 9:42 AM

## 2020-01-03 NOTE — Transfer of Care (Signed)
Immediate Anesthesia Transfer of Care Note  Patient: Sonya Berry  Procedure(s) Performed: LAPAROSCOPIC CHOLECYSTECTOMY WITH INTRAOPERATIVE CHOLANGIOGRAM (N/A Abdomen)  Patient Location: PACU  Anesthesia Type:General  Level of Consciousness: sedated  Airway & Oxygen Therapy: Patient Spontanous Breathing and Patient connected to nasal cannula oxygen  Post-op Assessment: Report given to RN, Post -op Vital signs reviewed and stable and Patient moving all extremities X 4  Post vital signs: Reviewed and stable  Last Vitals:  Vitals Value Taken Time  BP 135/71 01/03/20 0948  Temp    Pulse 75 01/03/20 0949  Resp 21 01/03/20 0949  SpO2 88 % 01/03/20 0949  Vitals shown include unvalidated device data.  Last Pain:  Vitals:   01/03/20 0752  TempSrc:   PainSc: 5       Patients Stated Pain Goal: 3 (50/01/64 2903)  Complications: No complications documented.

## 2020-01-03 NOTE — Discharge Instructions (Signed)
[facility] ______[facility] °LAPAROSCOPIC SURGERY: POST OP INSTRUCTIONS °Always review your discharge instruction sheet given to you by the facility where your surgery was performed. °IF YOU HAVE DISABILITY OR FAMILY LEAVE FORMS, YOU MUST BRING THEM TO THE OFFICE FOR PROCESSING.   °DO NOT GIVE THEM TO YOUR DOCTOR. ° °1. A prescription for pain medication may be given to you upon discharge.  Take your pain medication as prescribed, if needed.  If narcotic pain medicine is not needed, then you may take acetaminophen (Tylenol) or ibuprofen (Advil) as needed. °2. Take your usually prescribed medications unless otherwise directed. °3. If you need a refill on your pain medication, please contact your pharmacy.  They will contact our office to request authorization. Prescriptions will not be filled after 5pm or on week-ends. °4. You should follow a light diet the first few days after arrival home, such as soup and crackers, etc.  Be sure to include lots of fluids daily. °5. Most patients will experience some swelling and bruising in the area of the incisions.  Ice packs will help.  Swelling and bruising can take several days to resolve.  °6. It is common to experience some constipation if taking pain medication after surgery.  Increasing fluid intake and taking a stool softener (such as Colace) will usually help or prevent this problem from occurring.  A mild laxative (Milk of Magnesia or Miralax) should be taken according to package instructions if there are no bowel movements after 48 hours. °7. Unless discharge instructions indicate otherwise, you may remove your bandages 24-48 hours after surgery, and you may shower at that time.  You may have steri-strips (small skin tapes) in place directly over the incision.  These strips should be left on the skin for 7-10 days.  If your surgeon used skin glue on the incision, you may shower in 24 hours.  The glue will flake off over the next 2-3 weeks.  Any sutures or  staples will be removed at the office during your follow-up visit. °8. ACTIVITIES:  You may resume regular (light) daily activities beginning the next day--such as daily self-care, walking, climbing stairs--gradually increasing activities as tolerated.  You may have sexual intercourse when it is comfortable.  Refrain from any heavy lifting or straining until approved by your doctor. °a. You may drive when you are no longer taking prescription pain medication, you can comfortably wear a seatbelt, and you can safely maneuver your car and apply brakes. °b. RETURN TO WORK:  __________________________________________________________ °9. You should see your doctor in the office for a follow-up appointment approximately 2-3 weeks after your surgery.  Make sure that you call for this appointment within a day or two after you arrive home to insure a convenient appointment time. °10. OTHER INSTRUCTIONS: __________________________________________________________________________________________________________________________ __________________________________________________________________________________________________________________________ °WHEN TO CALL YOUR DOCTOR: °1. Fever over 101.0 °2. Inability to urinate °3. Continued bleeding from incision. °4. Increased pain, redness, or drainage from the incision. °5. Increasing abdominal pain ° °The clinic staff is available to answer your questions during regular business hours.  Please don’t hesitate to call and ask to speak to one of the nurses for clinical concerns.  If you have a medical emergency, go to the nearest emergency room or call 911.  A surgeon from [facility] is always on call at the hospital. °[address], [address], [place], [place]  [address] ? [address] [address], [place],    [address] °[phone] ? [phone] ? FAX [phone] °Web site:   [email or web address] °

## 2020-01-04 ENCOUNTER — Encounter (HOSPITAL_COMMUNITY): Payer: Self-pay | Admitting: Surgery

## 2020-01-04 LAB — SURGICAL PATHOLOGY

## 2020-04-29 ENCOUNTER — Other Ambulatory Visit: Payer: Self-pay | Admitting: Orthopedic Surgery

## 2020-04-29 DIAGNOSIS — M25561 Pain in right knee: Secondary | ICD-10-CM

## 2020-05-16 ENCOUNTER — Other Ambulatory Visit: Payer: Self-pay

## 2020-05-16 ENCOUNTER — Ambulatory Visit
Admission: RE | Admit: 2020-05-16 | Discharge: 2020-05-16 | Disposition: A | Discharge status: Home / Self Care | Payer: Medicaid Other | Source: Ambulatory Visit | Attending: Orthopedic Surgery | Admitting: Orthopedic Surgery

## 2020-05-16 DIAGNOSIS — M25561 Pain in right knee: Secondary | ICD-10-CM

## 2020-06-04 ENCOUNTER — Other Ambulatory Visit: Payer: Self-pay | Admitting: Orthopedic Surgery

## 2020-06-04 DIAGNOSIS — M25562 Pain in left knee: Secondary | ICD-10-CM

## 2020-06-23 ENCOUNTER — Other Ambulatory Visit: Payer: Medicaid Other
